# Patient Record
Sex: Male | Born: 1970
Health system: Southern US, Community
[De-identification: ages and names within clinical notes are randomized; demographics above are authoritative.]

## PROBLEM LIST (undated history)

## (undated) ENCOUNTER — Emergency Department (HOSPITAL_COMMUNITY): Disposition: A | Discharge status: Home / Self Care | Payer: Self-pay

## (undated) DIAGNOSIS — M549 Dorsalgia, unspecified: Secondary | ICD-10-CM

## (undated) DIAGNOSIS — G8929 Other chronic pain: Secondary | ICD-10-CM

## (undated) DIAGNOSIS — K219 Gastro-esophageal reflux disease without esophagitis: Secondary | ICD-10-CM

## (undated) HISTORY — DX: Gastro-esophageal reflux disease without esophagitis: K21.9

---

## 2002-06-28 ENCOUNTER — Encounter: Payer: Self-pay | Admitting: Emergency Medicine

## 2002-06-28 ENCOUNTER — Emergency Department (HOSPITAL_COMMUNITY): Admission: EM | Admit: 2002-06-28 | Discharge: 2002-06-28 | Payer: Self-pay

## 2006-04-13 ENCOUNTER — Emergency Department (HOSPITAL_COMMUNITY): Admission: EM | Admit: 2006-04-13 | Discharge: 2006-04-13 | Payer: Self-pay | Admitting: Emergency Medicine

## 2007-12-22 ENCOUNTER — Emergency Department (HOSPITAL_COMMUNITY): Admission: EM | Admit: 2007-12-22 | Discharge: 2007-12-22 | Payer: Self-pay | Admitting: Emergency Medicine

## 2009-09-29 ENCOUNTER — Emergency Department (HOSPITAL_COMMUNITY): Admission: EM | Admit: 2009-09-29 | Discharge: 2009-09-29 | Payer: Self-pay | Admitting: Family Medicine

## 2010-07-06 ENCOUNTER — Emergency Department (HOSPITAL_COMMUNITY): Admission: EM | Admit: 2010-07-06 | Discharge: 2010-07-06 | Payer: Self-pay | Admitting: Family Medicine

## 2011-01-13 ENCOUNTER — Inpatient Hospital Stay (INDEPENDENT_AMBULATORY_CARE_PROVIDER_SITE_OTHER)
Admission: RE | Admit: 2011-01-13 | Discharge: 2011-01-13 | Disposition: A | Discharge status: Home / Self Care | Payer: BC Managed Care – PPO | Source: Ambulatory Visit | Attending: Emergency Medicine | Admitting: Emergency Medicine

## 2011-01-13 DIAGNOSIS — R369 Urethral discharge, unspecified: Secondary | ICD-10-CM

## 2011-01-15 LAB — GC/CHLAMYDIA PROBE AMP, GENITAL
Chlamydia, DNA Probe: NEGATIVE
GC Probe Amp, Genital: POSITIVE — AB

## 2011-02-02 ENCOUNTER — Inpatient Hospital Stay (INDEPENDENT_AMBULATORY_CARE_PROVIDER_SITE_OTHER)
Admission: RE | Admit: 2011-02-02 | Discharge: 2011-02-02 | Disposition: A | Discharge status: Home / Self Care | Payer: BC Managed Care – PPO | Source: Ambulatory Visit | Attending: Family Medicine | Admitting: Family Medicine

## 2011-02-02 DIAGNOSIS — N342 Other urethritis: Secondary | ICD-10-CM

## 2011-03-31 ENCOUNTER — Inpatient Hospital Stay (INDEPENDENT_AMBULATORY_CARE_PROVIDER_SITE_OTHER)
Admission: RE | Admit: 2011-03-31 | Discharge: 2011-03-31 | Disposition: A | Discharge status: Home / Self Care | Payer: BC Managed Care – PPO | Source: Ambulatory Visit | Attending: Emergency Medicine | Admitting: Emergency Medicine

## 2011-03-31 DIAGNOSIS — M545 Low back pain: Secondary | ICD-10-CM

## 2011-06-16 ENCOUNTER — Inpatient Hospital Stay (INDEPENDENT_AMBULATORY_CARE_PROVIDER_SITE_OTHER)
Admission: RE | Admit: 2011-06-16 | Discharge: 2011-06-16 | Disposition: A | Discharge status: Home / Self Care | Payer: BC Managed Care – PPO | Source: Ambulatory Visit | Attending: Family Medicine | Admitting: Family Medicine

## 2011-06-16 DIAGNOSIS — F659 Paraphilia, unspecified: Secondary | ICD-10-CM

## 2011-06-16 LAB — CBC
HCT: 43.7
Hemoglobin: 14.3
MCHC: 32.6
MCV: 74 — ABNORMAL LOW
Platelets: 245
RBC: 5.91 — ABNORMAL HIGH
RDW: 15
WBC: 8.1

## 2011-06-16 LAB — POCT CARDIAC MARKERS
CKMB, poc: <1 — ABNORMAL LOW
Myoglobin, poc: 43.9
Operator id: 234501

## 2011-06-16 LAB — POCT I-STAT, CHEM 8
Calcium, Ion: 1.28
Glucose, Bld: 76
HCT: 47
Hemoglobin: 16

## 2011-06-16 LAB — DIFFERENTIAL
Basophils Absolute: 0
Basophils Relative: 0
Neutro Abs: 4.9
Neutrophils Relative %: 60

## 2011-07-07 ENCOUNTER — Emergency Department (HOSPITAL_COMMUNITY)
Admission: EM | Admit: 2011-07-07 | Discharge: 2011-07-07 | Disposition: A | Discharge status: Home / Self Care | Payer: No Typology Code available for payment source | Attending: Emergency Medicine | Admitting: Emergency Medicine

## 2011-07-07 ENCOUNTER — Emergency Department (HOSPITAL_COMMUNITY): Payer: No Typology Code available for payment source

## 2011-07-07 DIAGNOSIS — S139XXA Sprain of joints and ligaments of unspecified parts of neck, initial encounter: Secondary | ICD-10-CM | POA: Insufficient documentation

## 2011-07-07 DIAGNOSIS — M545 Low back pain, unspecified: Secondary | ICD-10-CM | POA: Insufficient documentation

## 2011-07-07 DIAGNOSIS — M542 Cervicalgia: Secondary | ICD-10-CM | POA: Insufficient documentation

## 2014-07-23 ENCOUNTER — Emergency Department (HOSPITAL_COMMUNITY)
Admission: EM | Admit: 2014-07-23 | Discharge: 2014-07-23 | Disposition: A | Discharge status: Home / Self Care | Payer: Commercial Managed Care - PPO | Attending: Emergency Medicine | Admitting: Emergency Medicine

## 2014-07-23 ENCOUNTER — Emergency Department (HOSPITAL_COMMUNITY): Payer: Commercial Managed Care - PPO

## 2014-07-23 ENCOUNTER — Encounter (HOSPITAL_COMMUNITY): Payer: Self-pay | Admitting: *Deleted

## 2014-07-23 DIAGNOSIS — R05 Cough: Secondary | ICD-10-CM

## 2014-07-23 DIAGNOSIS — R059 Cough, unspecified: Secondary | ICD-10-CM

## 2014-07-23 DIAGNOSIS — R1110 Vomiting, unspecified: Secondary | ICD-10-CM | POA: Insufficient documentation

## 2014-07-23 DIAGNOSIS — R0789 Other chest pain: Secondary | ICD-10-CM | POA: Diagnosis not present

## 2014-07-23 DIAGNOSIS — R0602 Shortness of breath: Secondary | ICD-10-CM | POA: Diagnosis not present

## 2014-07-23 DIAGNOSIS — R079 Chest pain, unspecified: Secondary | ICD-10-CM | POA: Diagnosis present

## 2014-07-23 LAB — CBC
HCT: 42.6 % (ref 39.0–52.0)
HEMOGLOBIN: 13.8 g/dL (ref 13.0–17.0)
MCH: 23 pg — ABNORMAL LOW (ref 26.0–34.0)
MCHC: 32.4 g/dL (ref 30.0–36.0)
MCV: 71.1 fL — ABNORMAL LOW (ref 78.0–100.0)
Platelets: 260 10*3/uL (ref 150–400)
RBC: 5.99 MIL/uL — ABNORMAL HIGH (ref 4.22–5.81)
RDW: 15.2 % (ref 11.5–15.5)
WBC: 7.5 10*3/uL (ref 4.0–10.5)

## 2014-07-23 LAB — BASIC METABOLIC PANEL
ANION GAP: 15 (ref 5–15)
BUN: 14 mg/dL (ref 6–23)
CALCIUM: 9.6 mg/dL (ref 8.4–10.5)
CO2: 23 meq/L (ref 19–32)
CREATININE: 0.81 mg/dL (ref 0.50–1.35)
Chloride: 103 mEq/L (ref 96–112)
GFR calc Af Amer: >90 mL/min (ref >90)
GFR calc non Af Amer: >90 mL/min (ref >90)
Glucose, Bld: 94 mg/dL (ref 70–99)
Potassium: 4 mEq/L (ref 3.7–5.3)
Sodium: 141 mEq/L (ref 137–147)

## 2014-07-23 LAB — I-STAT TROPONIN, ED: Troponin i, poc: 0.01 ng/mL (ref 0.00–0.08)

## 2014-07-23 MED ORDER — OMEPRAZOLE 20 MG PO CPDR: 20.0000 mg | DELAYED_RELEASE_CAPSULE | Freq: Every day | ORAL | Status: DC

## 2014-07-23 NOTE — ED Provider Notes (Signed)
CSN: [id number]     Arrival date & time 07/23/14  1425 History   First MD Initiated Contact with Patient 07/23/14 1737     Chief Complaint  Patient presents with  . Chest Pain  . Pleurisy     (Consider location/radiation/quality/duration/timing/severity/associated sxs/prior Treatment) Patient is a 43 y.o. male presenting with chest pain.  Chest Pain Pain location:  L chest Pain quality: sharp   Pain radiates to:  Does not radiate Pain severity:  Moderate Onset quality: gradual, but acutely worse today. Duration: Greater than 1 week. Timing:  Constant Progression:  Worsening Chronicity:  New Context: lifting   Relieved by:  Rest Exacerbated by: Twisting, turning, palpation. Associated symptoms: shortness of breath (Particularly during coughing fits.) and vomiting (After coughing fits.)   Associated symptoms: no abdominal pain, no diaphoresis, no dizziness, no fever and no nausea     History reviewed. No pertinent past medical history. History reviewed. No pertinent past surgical history. History reviewed. No pertinent family history. History  Substance Use Topics  . Smoking status: Never Smoker   . Smokeless tobacco: Not on file  . Alcohol Use: No    Review of Systems  Constitutional: Negative for fever and diaphoresis.  Respiratory: Positive for shortness of breath (Particularly during coughing fits.).   Cardiovascular: Positive for chest pain.  Gastrointestinal: Positive for vomiting (After coughing fits.). Negative for nausea and abdominal pain.  Neurological: Negative for dizziness.  All other systems reviewed and are negative.     Allergies  Review of patient's allergies indicates no known allergies.  Home Medications   Prior to Admission medications   Not on File   BP 136/92 mmHg  Pulse 80  Temp(Src) 97.7 F (36.5 C) (Oral)  Resp 16  Ht 5\' 9"  (1.753 m)  Wt 230 lb (104.327 kg)  BMI 33.95 kg/m2  SpO2 100% Physical Exam  Constitutional: He is  oriented to person, place, and time. He appears well-developed and well-nourished. No distress.  HENT:  Head: Normocephalic and atraumatic.  Mouth/Throat: Oropharynx is clear and moist.  Eyes: Conjunctivae are normal. Pupils are equal, round, and reactive to light. No scleral icterus.  Neck: Neck supple.  Cardiovascular: Normal rate, regular rhythm, normal heart sounds and intact distal pulses.   No murmur heard. Pulmonary/Chest: Effort normal and breath sounds normal. No stridor. No respiratory distress. He has no wheezes. He has no rales. He exhibits tenderness (Left sided).  Abdominal: Soft. He exhibits no distension. There is no tenderness. There is no rebound and no guarding.  Musculoskeletal: Normal range of motion. He exhibits no edema.  Neurological: He is alert and oriented to person, place, and time.  Skin: Skin is warm and dry. No rash noted.  Psychiatric: He has a normal mood and affect. His behavior is normal.  Nursing note and vitals reviewed.   ED Course  Procedures (including critical care time) Labs Review Labs Reviewed  CBC - Abnormal; Notable for the following:    RBC 5.99 (*)    MCV 71.1 (*)    MCH 23.0 (*)    All other components within normal limits  BASIC METABOLIC PANEL  I-STAT TROPOININ, ED    Imaging Review Dg Chest 2 View  07/23/2014   CLINICAL DATA:  Two days of shortness of breath and chest pain; no history of tobacco use.  EXAM: CHEST  2 VIEW  COMPARISON:  PA and lateral chest x-ray of December 22, 2007  FINDINGS: The lungs are less well inflated today. There is  no focal infiltrate. There is some crowding of the pulmonary interstitial markings. The cardiac silhouette is top-normal in size. The pulmonary vascularity is not engorged. There is mild tortuosity of the descending thoracic aorta. The bony thorax is unremarkable.  IMPRESSION: The study is limited due to hypoinflation. There is no definite evidence of pneumonia nor other acute cardiopulmonary  abnormality.   Electronically Signed   By: David  Martinique   On: 07/23/2014 15:22  All radiology studies independently viewed by me.      EKG Interpretation   Date/Time:  Monday July 23 2014 14:29:00 EST Ventricular Rate:  101 PR Interval:  146 QRS Duration: 96 QT Interval:  332 QTC Calculation: 430 R Axis:   69 Text Interpretation:  Sinus tachycardia Incomplete right bundle branch  block Cannot rule out Inferior infarct , age undetermined Cannot rule out  Anterior infarct , age undetermined Abnormal ECG compared to prior, now  shows borderline Incomplete RBBB morphology Confirmed by [facility]  MD, TREY  ([id number]) on 07/23/2014 6:53:14 PM      MDM   Final diagnoses:  Chest pain    43 year old male presenting with left sided chest wall pain. 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 His exam and history are consistent with musculoskeletal chest wall pain.  He has no cardiac risk factors. His EKG shows some questionable changes, his history is in no way consistent with ACS.  He also complains of a nightly cough, which is exacerbating his left chest wall pain. Suspect this is from GERD. Recommended trial of omeprazole. Advised follow-up with PCP for recheck.    Artis Delay, MD 07/23/14 [phone]

## 2014-07-23 NOTE — Discharge Instructions (Signed)
Chest Wall Pain °Chest wall pain is pain in or around the bones and muscles of your chest. It may take up to 6 weeks to get better. It may take longer if you must stay physically active in your work and activities.  °CAUSES  °Chest wall pain may happen on its own. However, it may be caused by: °· A viral illness like the flu. °· Injury. °· Coughing. °· Exercise. °· Arthritis. °· Fibromyalgia. °· Shingles. °HOME CARE INSTRUCTIONS  °· Avoid overtiring physical activity. Try not to strain or perform activities that cause pain. This includes any activities using your chest or your abdominal and side muscles, especially if heavy weights are used. °· Put ice on the sore area. °¨ Put ice in a plastic bag. °¨ Place a towel between your skin and the bag. °¨ Leave the ice on for 15-20 minutes per hour while awake for the first 2 days. °· Only take over-the-counter or prescription medicines for pain, discomfort, or fever as directed by your caregiver. °SEEK IMMEDIATE MEDICAL CARE IF:  °· Your pain increases, or you are very uncomfortable. °· You have a fever. °· Your chest pain becomes worse. °· You have new, unexplained symptoms. °· You have nausea or vomiting. °· You feel sweaty or lightheaded. °· You have a cough with phlegm (sputum), or you cough up blood. °MAKE SURE YOU:  °· Understand these instructions. °· Will watch your condition. °· Will get help right away if you are not doing well or get worse. °Document Released: 09/07/2005 Document Revised: 11/30/2011 Document Reviewed: 05/04/2011 °ExitCare® Patient Information ©2015 [employer]. This information is not intended to replace advice given to you by your health care provider. Make sure you discuss any questions you have with your health care provider. ° ° °Emergency Department Resource Guide °1) Find a Doctor and Pay Out of Pocket °Although you won't have to find out who is covered by your insurance plan, it is a good idea to ask around and get recommendations. You  will then need to call the office and see if the doctor you have chosen will accept you as a new patient and what types of options they offer for patients who are self-pay. Some doctors offer discounts or will set up payment plans for their patients who do not have insurance, but you will need to ask so you aren't surprised when you get to your appointment. ° °2) Contact Your Local Health Department °Not all health departments have doctors that can see patients for sick visits, but many do, so it is worth a call to see if yours does. If you don't know where your local health department is, you can check in your phone book. The CDC also has a tool to help you locate your state's health department, and many state websites also have listings of all of their local health departments. ° °3) Find a Walk-in Clinic °If your illness is not likely to be very severe or complicated, you may want to try a walk in clinic. These are popping up all over the country in pharmacies, drugstores, and shopping centers. They're usually staffed by nurse practitioners or physician assistants that have been trained to treat common illnesses and complaints. They're usually fairly quick and inexpensive. However, if you have serious medical issues or chronic medical problems, these are probably not your best option. ° °No Primary Care Doctor: °- Call [facility] at  832-8000 - they can help you locate a primary care doctor that    accepts your insurance, provides certain services, etc. °- Physician Referral Service- 1-800-533-3463 ° °Chronic Pain Problems: °Organization         Address  Phone   Notes  °[facility]  (336) 297-2271 Patients need to be referred by their primary care doctor.  ° °Medication Assistance: °Organization         Address  Phone   Notes  °[place] [facility] [address] °[place], [place] [address] (336) 641-8030 --Must be a resident of [place] °-- Must  have NO insurance coverage whatsoever (no Medicaid/ Medicare, etc.) °-- The pt. MUST have a primary care doctor that directs their care regularly and follows them in the community °  °[facility]  (866) 331-1348   °[employer]  (888) 892-1162   ° °Agencies that provide inexpensive medical care: °Organization         Address  Phone   Notes  °[facility]  (336) 832-8035   °[facility]    (336) 832-7272   °[facility] [address] °[place], [place] [address] (336) 832-4777   °[facility] [address], °[place] (336) 271-4999   °[facility]    (336) 373-0678   °[facility]    (336) 272-1050   °[facility] ° [address], [place] Phone:  (336) 832-4444, Fax:  (336) 832-4440 Hours of Operation:  9 am - 6 pm, M-F.  Also accepts Medicaid/Medicare and self-pay.  °[facility] ° [address], [place] Phone: (336) 832-3150, Fax: (336) 832-3151. Hours of Operation:  8:30 am - 5:30 pm, M-F.  Also accepts Medicaid and self-pay.  °[facility] [place] [address], [place] Phone: (336) 878-6027   °[facility] [address], [place], [place] (336)723-1848, Ext. 123 Mondays & Thursdays: 7-9 AM.  First 15 patients are seen on a first come, first serve basis. °  ° °Medicaid-accepting [place] Providers: ° °Organization         Address  Phone   Notes  °[facility] [address], [place] (336) 641-2100 Also accepts self-pay patients.  °[facility] [address], [place] ° (336) 856-9996   °[facility] [address], [place] (336) 288-8857   °[facility] [address] [place] [address], [place] (336) 299-7000   °Veita Bland [address], [place]  ° (336) 373-1557 Only accepts [place] Access Medicaid patients after  they have their name applied to their card.  ° °Self-Pay (no insurance) in [place]: ° °Organization         Address  Phone   Notes  °Sickle Cell Patients, [facility] [address], [place] (336) 832-1970   °[facility] [address], [place] (336) 832-4400   ° [facility] ° [address], [place] (336) 992-4800   °[facility]/Dr. Osei-Bonsu ° [address] [place] [address], [place] or [address], [place] (336) 841-8500 Phone number for both [place] and [place] locations is the same.  °[facility] [address], [place] (336) 299-0000   °[facility] [address] [place] [address],  or [address] (336) 852-7530 °(336) 878-2260   °[facility] [address], [place] (336) 350-1642, phone; (336) 294-5005, fax Sees patients 1st and 3rd Saturday of every month.  Must not qualify for public or private insurance (i.e. Medicaid, Medicare, [place] Health Choice, Veterans' Benefits) • Household income should be no more than 200% of the poverty level •The clinic cannot treat you if you are pregnant or think you are pregnant • Sexually transmitted diseases are not treated at the clinic.  ° ° °Dental Care: °Organization         Address  Phone  Notes  °[facility] [address], [place] (336) 641-6152 Accepts children up to age 21 who are enrolled in Medicaid or [place] Health Choice; pregnant women with a Medicaid card; and children who have applied for Medicaid or [place] Health Choice, but were declined, whose parents can pay a reduced fee at time of service.  °[facility] [place]  [address], [place] (336) 641-7733 Accepts children up to age 21 who are enrolled in Medicaid or [place] Health Choice; pregnant women with a Medicaid card; and children who  have applied for Medicaid or [place] Health Choice, but were declined, whose parents can pay a reduced fee at time of service.  °[facility] ° [address], [place] (336) 641-4533 Patients are seen by appointment only. Walk-ins are not accepted. [facility] will see patients 18 years of age and older. °Monday - Tuesday (8am-5pm) °Most Wednesdays (8:30-5pm) °$30 per visit, cash only  °[facility] ° [address], [place] (336) 641-4533 Patients are seen by appointment only. Walk-ins are not accepted. [facility] will see patients 18 years of age and older. °One Wednesday Evening (Monthly: Volunteer Based).  $30 per visit, cash only  °[facility]  (919) 537-3737 for adults; Children under age 4, call Graduate Pediatric Dentistry at (919) [phone]. Children aged 4-14, please call (919) [phone] to request a pediatric application. ° Dental services are provided in all areas of dental care including fillings, crowns and bridges, complete and partial dentures, implants, gum treatment, root canals, and extractions. Preventive care is also provided. Treatment is provided to both adults and children. °Patients are selected via a lottery and there is often a waiting list. °  °[facility] [address], °[place] ° (336) 763-8833 [email or web address] °  °[facility] [address], [place], [place] (336)723-1848, Ext. 123 Second and Fourth Thursday of each month, opens at 6:30 AM; Clinic ends at 9 AM.  Patients are seen on a first-come first-served basis, and a limited number are seen during each clinic.  ° °[facility] ° [address], [place], [place] (336) 723-7904   Eligibility Requirements °You must have lived in [place], [place], or [place] counties for at least the last three months. °  You cannot be eligible for state or federal sponsored healthcare insurance, including Veterans  Administration, Medicaid, or Medicare. °  You generally cannot be eligible for healthcare insurance through your employer.  °  How to apply: °Eligibility screenings are held every Tuesday and Wednesday afternoon from 1:00 pm until 4:00 pm. You do not need an appointment for the interview!  °[facility] [address], [place], [place] 336-631-2330   °[facility]  336-342-8273   °[facility]  336-703-3100   °[facility]    336-570-6415   ° °Behavioral Health Resources in the Community: °Intensive Outpatient Programs °Organization         Address  Phone  Notes  °[facility] [address], [place], [place] 336-878-6098   °[facility] [address], [place], [place] 336-832-9800   °[facility] [address], [place], [place] ° 336-882-2125   °[facility] [address],  °[place], [place] 1-800-853-5163 or 336-641-4981   °Substance Abuse Resources °Organization         Address  Phone  Notes  °[facility]  336-882-2125   °[facility]  336-784-9470   °[facility]  336-285-9073   °[facility]  336-845-3988   °Residential & Outpatient Substance Abuse Program  1-800-659-3381   °Psychological Services °Organization         Address  Phone  Notes  °[facility]  336- 832-9600   °[facility]  336- 378-7881   °[facility] [address], [place] 1-800-853-5163 or 336-641-4981   ° °Mobile Crisis Teams °Organization         Address  Phone  Notes  °[facility], Mobile Crisis Care Unit  1-877-626-1772   °Assertive °[facility] ° [address] [place], [place] 336-834-9664   °Sharon DeEsch [address] °[place] 336-554-5454   ° °Self-Help/Support Groups °Organization         Address  Phone             Notes  °[facility] -  variety of support groups  336- 373-1402 Call for more information  °Narcotics Anonymous (NA), [facility] [address], °[place]  2 meetings at this location  ° °Residential Treatment Programs °Organization         Address  Phone  Notes  °[facility] [address],    °[place]  1-866-801-8205   °[facility] ° [address], [place], [place] 704-293-8524   °[facility] [address], [place] 336-845-3988 Admissions: 8am-3pm M-F  °[facility] [address]    °[place], [place] 336-841-1104   °[facility] [address], [place], [place] 336-379-7146   °[facility] [address]  °[place], [place] 336-285-9073   °[facility] - Intensive Outpatient [address], [place], [place] 336-852-3033   °[facility] [address]  °[place], [place] 1-877-615-2722 or 336-784-9470   °[facility] [address] [place], [place] 336-227-7417 Accepts Medicaid  °[facility] [address]  ° [place] 1-800-659-3381 Substance Abuse/Addiction Treatment  ° °[place] Behavioral Health Resources °Organization         Address  Phone  Notes  °[facility]  (888) 581-9988   °Julie Brannon, PhD [address] [place], [place]   (336) 349-5553 or (336) 951-0000   °[facility]   [address] °[place], [place] (336) 349-4454   °[facility] [address], [place], [place] (336) 342-8316 Insurance/Medicaid/sponsorship through [facility]  °[facility] [address]                                    [place], [place] (336) 342-8316 Therapy/tele-psych/case  °[facility] [address]  ° [place], [place] (336) 349-2233    °Dr. Arfeen  (336) 349-4544   °[facility] [place]  [employer] [place] [facility] 1) [address], [place] °2) [address], [place] °3)  [address], [place] (336) 349-3220 °(336) 342-7768 ° °(336) 342-8140   °[place] [facility] (336) 342-1394 or (336) 342-3537 (After Hours)    ° ° ° °

## 2014-07-23 NOTE — ED Notes (Signed)
Left sided cp, radiating to left arm and jaw. Intermittently x 2 days. Hurts more when sitting up and chest out.  Did not take nothing for pain. No pcp.

## 2015-01-12 ENCOUNTER — Encounter (HOSPITAL_COMMUNITY): Payer: Self-pay | Admitting: Emergency Medicine

## 2015-01-12 DIAGNOSIS — M546 Pain in thoracic spine: Secondary | ICD-10-CM | POA: Diagnosis not present

## 2015-01-12 DIAGNOSIS — R0602 Shortness of breath: Secondary | ICD-10-CM | POA: Diagnosis not present

## 2015-01-12 DIAGNOSIS — R079 Chest pain, unspecified: Secondary | ICD-10-CM | POA: Diagnosis present

## 2015-01-12 DIAGNOSIS — Z79899 Other long term (current) drug therapy: Secondary | ICD-10-CM | POA: Diagnosis not present

## 2015-01-12 DIAGNOSIS — R0789 Other chest pain: Secondary | ICD-10-CM | POA: Insufficient documentation

## 2015-01-12 LAB — PROTIME-INR
INR: 0.97 (ref 0.00–1.49)
Prothrombin Time: 13 seconds (ref 11.6–15.2)

## 2015-01-12 LAB — I-STAT TROPONIN, ED: TROPONIN I, POC: 0.01 ng/mL (ref 0.00–0.08)

## 2015-01-12 NOTE — ED Notes (Signed)
Patient here with complaint of left chest pain with radiation to left lateral ribs and around to back. Palpation of posterior left ribs increases pain. Patient states that he was opening the trunk of his vehicle today when the pain began. States that deep breath exacerbates pain and certain movements worsen pain.

## 2015-01-13 ENCOUNTER — Emergency Department (HOSPITAL_COMMUNITY): Payer: Commercial Managed Care - PPO

## 2015-01-13 ENCOUNTER — Emergency Department (HOSPITAL_COMMUNITY)
Admission: EM | Admit: 2015-01-13 | Discharge: 2015-01-13 | Disposition: A | Discharge status: Home / Self Care | Payer: Commercial Managed Care - PPO | Attending: Emergency Medicine | Admitting: Emergency Medicine

## 2015-01-13 DIAGNOSIS — R0789 Other chest pain: Secondary | ICD-10-CM

## 2015-01-13 LAB — BASIC METABOLIC PANEL
Anion gap: 8 (ref 5–15)
BUN: 13 mg/dL (ref 6–23)
CHLORIDE: 105 mmol/L (ref 96–112)
CO2: 23 mmol/L (ref 19–32)
Calcium: 9.2 mg/dL (ref 8.4–10.5)
Creatinine, Ser: 0.88 mg/dL (ref 0.50–1.35)
Glucose, Bld: 99 mg/dL (ref 70–99)
POTASSIUM: 4 mmol/L (ref 3.5–5.1)
SODIUM: 136 mmol/L (ref 135–145)

## 2015-01-13 LAB — CBC
HEMATOCRIT: 41.7 % (ref 39.0–52.0)
HEMOGLOBIN: 13.7 g/dL (ref 13.0–17.0)
MCH: 23.6 pg — ABNORMAL LOW (ref 26.0–34.0)
MCHC: 32.9 g/dL (ref 30.0–36.0)
MCV: 71.8 fL — ABNORMAL LOW (ref 78.0–100.0)
Platelets: 257 10*3/uL (ref 150–400)
RBC: 5.81 MIL/uL (ref 4.22–5.81)
RDW: 15 % (ref 11.5–15.5)
WBC: 8.3 10*3/uL (ref 4.0–10.5)

## 2015-01-13 MED ORDER — NAPROXEN 500 MG PO TABS: 500.0000 mg | ORAL_TABLET | Freq: Two times a day (BID) | ORAL | Status: DC

## 2015-01-13 MED ORDER — OXYCODONE-ACETAMINOPHEN 5-325 MG PO TABS
2.0000 | ORAL_TABLET | Freq: Once | ORAL | Status: AC
  Administered 2015-01-13: 2 via ORAL
  Filled 2015-01-13: qty 2

## 2015-01-13 MED ORDER — METHOCARBAMOL 500 MG PO TABS
1000.0000 mg | ORAL_TABLET | Freq: Once | ORAL | Status: AC
  Administered 2015-01-13: 500 mg via ORAL
  Filled 2015-01-13: qty 2

## 2015-01-13 MED ORDER — OXYCODONE-ACETAMINOPHEN 5-325 MG PO TABS
2.0000 | ORAL_TABLET | ORAL | Status: DC | PRN
Start: 2015-01-13 — End: 2015-10-09

## 2015-01-13 MED ORDER — METHOCARBAMOL 750 MG PO TABS: 750.0000 mg | ORAL_TABLET | Freq: Four times a day (QID) | ORAL | Status: DC

## 2015-01-13 MED ORDER — OMEPRAZOLE 20 MG PO CPDR: 20.0000 mg | DELAYED_RELEASE_CAPSULE | Freq: Every day | ORAL | Status: DC

## 2015-01-13 NOTE — ED Provider Notes (Signed)
CSN: [id number]     Arrival date & time 01/12/15  2306 History  This chart was scribed for Linton Flemings, MD by Eustaquio Maize, ED Scribe. This patient was seen in room A08C/A08C and the patient's care was started at 1:30 AM.    Chief Complaint  Patient presents with  . Chest Pain  . Shortness of Breath   The history is provided by the patient. No language interpreter was used.     HPI Comments: Trevor Ramos is a 44 y.o. male who presents to the Emergency Department complaining of sudden onset, constant left sided chest pain radiating to mid back that began around 1 PM today (12 hours ago). Pt was opening the trunk of his car when the pain began. He describes the pain as a sharp sensation. The pain is exacerbated with movement. He also complains of shortness of breath. Pt took Ibuprofen earlier with mild relief. He reports that he mowed his lawn earlier in the week but denies any new recent strenuous activity. He denies diaphoresis, nausea, vomiting, leg swelling, or any other symptoms. No recent prolonged travel. Occasional smoker. Denies positive family history of cardiac disease.    History reviewed. No pertinent past medical history. History reviewed. No pertinent past surgical history. History reviewed. No pertinent family history. History  Substance Use Topics  . Smoking status: Never Smoker   . Smokeless tobacco: Not on file  . Alcohol Use: Yes     Comment: occ    Review of Systems  Constitutional: Negative for fever and diaphoresis.  HENT: Negative for rhinorrhea.   Respiratory: Positive for shortness of breath.   Cardiovascular: Positive for chest pain. Negative for leg swelling.  Gastrointestinal: Negative for nausea and vomiting.  Musculoskeletal: Positive for back pain.  Neurological: Negative for speech difficulty.  Psychiatric/Behavioral: Negative for confusion.      Allergies  Review of patient's allergies indicates no known allergies.  Home Medications    Prior to Admission medications   Medication Sig Start Date End Date Taking? Authorizing Provider  omeprazole (PRILOSEC) 20 MG capsule Take 1 capsule (20 mg total) by mouth daily. 07/23/14  Yes Serita Grit, MD  methocarbamol (ROBAXIN-750) 750 MG tablet Take 1 tablet (750 mg total) by mouth 4 (four) times daily. 01/13/15   Linton Flemings, MD  naproxen (NAPROSYN) 500 MG tablet Take 1 tablet (500 mg total) by mouth 2 (two) times daily. 01/13/15   Linton Flemings, MD  oxyCODONE-acetaminophen (PERCOCET/ROXICET) 5-325 MG per tablet Take 2 tablets by mouth every 4 (four) hours as needed for severe pain. 01/13/15   Linton Flemings, MD   Triage Vitals: BP 116/78 mmHg  Pulse 79  Temp(Src) 98.2 F (36.8 C)  Resp 20  SpO2 98%   Physical Exam  Constitutional: He is oriented to person, place, and time. He appears well-developed and well-nourished.  HENT:  Head: Normocephalic and atraumatic.  Nose: Nose normal.  Mouth/Throat: Oropharynx is clear and moist.  Eyes: Conjunctivae and EOM are normal. Pupils are equal, round, and reactive to light.  Neck: Normal range of motion. Neck supple. No JVD present. No tracheal deviation present. No thyromegaly present.  Cardiovascular: Normal rate, regular rhythm, normal heart sounds and intact distal pulses.  Exam reveals no gallop and no friction rub.   No murmur heard. Pulmonary/Chest: Effort normal and breath sounds normal. No stridor. No respiratory distress. He has no wheezes. He has no rales. He exhibits tenderness (patient has tenderness just under left nipple starting his sternum and wrapping  around to left scapula.  There is no overlying skin findings, no rash.  No vesicles.  Pain is worse with compression of the chest, anterior to posterior.).  Abdominal: Soft. Bowel sounds are normal. He exhibits no distension and no mass. There is no tenderness. There is no rebound and no guarding.  Musculoskeletal: Normal range of motion. He exhibits no edema or tenderness.   Lymphadenopathy:    He has no cervical adenopathy.  Neurological: He is alert and oriented to person, place, and time. He displays normal reflexes. He exhibits normal muscle tone. Coordination normal.  Skin: Skin is warm and dry. No rash noted. No erythema. No pallor.  Psychiatric: He has a normal mood and affect. His behavior is normal. Judgment and thought content normal.  Nursing note and vitals reviewed.   ED Course  Procedures (including critical care time)  DIAGNOSTIC STUDIES: Oxygen Saturation is 98% on RA, normal by my interpretation.    COORDINATION OF CARE: 1:35 AM-Discussed treatment plan which includes pain medication with pt at bedside and pt agreed to plan.   Labs Review Labs Reviewed  CBC - Abnormal; Notable for the following:    MCV 71.8 (*)    MCH 23.6 (*)    All other components within normal limits  BASIC METABOLIC PANEL  PROTIME-INR  Randolm Idol, ED    Imaging Review Dg Chest 2 View  01/13/2015   CLINICAL DATA:  Left-sided chest pain  EXAM: CHEST  2 VIEW  COMPARISON:  07/23/2014.  FINDINGS: Mildly enlarged cardiomediastinal silhouette. Aortic atherosclerosis. Low lung volumes. No active infiltrates or failure. No osseous findings.  IMPRESSION: Cardiomegaly.  No active infiltrates or failure.   Electronically Signed   By: Rolla Flatten M.D.   On: 01/13/2015 02:00     EKG Interpretation   Date/Time:  Saturday January 12 2015 23:12:21 EDT Ventricular Rate:  85 PR Interval:  150 QRS Duration: 94 QT Interval:  356 QTC Calculation: 423 R Axis:   55 Text Interpretation:  Normal sinus rhythm Minimal voltage criteria for  LVH, may be normal variant Inferior infarct , age undetermined Abnormal  ECG No significant change since last tracing Confirmed by Quanell Loughney  MD, Chaeli Judy  ([id number]) on 01/12/2015 11:16:52 PM      MDM   Final diagnoses:  Chest wall pain    I personally performed the services described in this documentation, which was scribed in my  presence. The recorded information has been reviewed and is accurate.  44 year old male with left-sided chest pain ongoing throughout the day day after opening the trunk of his car.  Patient denies any strenuous activity, no lifting, twisting.  He has had chest wall pain in the past, but nothing like this.  Is not pleuritic in nature.  He is not short of breath.  Workup here without signs of ACS.  Perc negative.  Pain is reproducible with palpation.  No fractures or other abnormalities noted on chest x-ray.  Plan for treatment of chest wall pain with close follow-up with primary care doctor.     Linton Flemings, MD 01/13/15 [id number]

## 2015-01-13 NOTE — Discharge Instructions (Signed)
Chest Wall Pain Chest wall pain is pain in or around the bones and muscles of your chest. It may take up to 6 weeks to get better. It may take longer if you must stay physically active in your work and activities.  CAUSES  Chest wall pain may happen on its own. However, it may be caused by:  A viral illness like the flu.  Injury.  Coughing.  Exercise.  Arthritis.  Fibromyalgia.  Shingles. HOME CARE INSTRUCTIONS   Avoid overtiring physical activity. Try not to strain or perform activities that cause pain. This includes any activities using your chest or your abdominal and side muscles, especially if heavy weights are used.  Put ice on the sore area.  Put ice in a plastic bag.  Place a towel between your skin and the bag.  Leave the ice on for 15-20 minutes per hour while awake for the first 2 days.  Only take over-the-counter or prescription medicines for pain, discomfort, or fever as directed by your caregiver. SEEK IMMEDIATE MEDICAL CARE IF:   Your pain increases, or you are very uncomfortable.  You have a fever.  Your chest pain becomes worse.  You have new, unexplained symptoms.  You have nausea or vomiting.  You feel sweaty or lightheaded.  You have a cough with phlegm (sputum), or you cough up blood. MAKE SURE YOU:   Understand these instructions.  Will watch your condition.  Will get help right away if you are not doing well or get worse. Document Released: 09/07/2005 Document Revised: 11/30/2011 Document Reviewed: 05/04/2011 [facility] Patient Information 2015 [place], [place]. This information is not intended to replace advice given to you by your health care provider. Make sure you discuss any questions you have with your health care provider.  Musculoskeletal Pain Musculoskeletal pain is muscle and boney aches and pains. These pains can occur in any part of the body. Your caregiver may treat you without knowing the cause of the pain. They may treat you  if blood or urine tests, X-rays, and other tests were normal.  CAUSES There is often not a definite cause or reason for these pains. These pains may be caused by a type of germ (virus). The discomfort may also come from overuse. Overuse includes working out too hard when your body is not fit. Boney aches also come from weather changes. Bone is sensitive to atmospheric pressure changes. HOME CARE INSTRUCTIONS   Ask when your test results will be ready. Make sure you get your test results.  Only take over-the-counter or prescription medicines for pain, discomfort, or fever as directed by your caregiver. If you were given medications for your condition, do not drive, operate machinery or power tools, or sign legal documents for 24 hours. Do not drink alcohol. Do not take sleeping pills or other medications that may interfere with treatment.  Continue all activities unless the activities cause more pain. When the pain lessens, slowly resume normal activities. Gradually increase the intensity and duration of the activities or exercise.  During periods of severe pain, bed rest may be helpful. Lay or sit in any position that is comfortable.  Putting ice on the injured area.  Put ice in a bag.  Place a towel between your skin and the bag.  Leave the ice on for 15 to 20 minutes, 3 to 4 times a day.  Follow up with your caregiver for continued problems and no reason can be found for the pain. If the pain becomes worse  or does not go away, it may be necessary to repeat tests or do additional testing. Your caregiver may need to look further for a possible cause. SEEK IMMEDIATE MEDICAL CARE IF:  You have pain that is getting worse and is not relieved by medications.  You develop chest pain that is associated with shortness or breath, sweating, feeling sick to your stomach (nauseous), or throw up (vomit).  Your pain becomes localized to the abdomen.  You develop any new symptoms that seem different  or that concern you. MAKE SURE YOU:   Understand these instructions.  Will watch your condition.  Will get help right away if you are not doing well or get worse. Document Released: 09/07/2005 Document Revised: 11/30/2011 Document Reviewed: 05/12/2013 [facility] [place] [facility] Patient Information 2015 [place], [place]. This information is not intended to replace advice given to you by your health care provider. Make sure you discuss any questions you have with your health care provider.

## 2015-10-09 ENCOUNTER — Ambulatory Visit (INDEPENDENT_AMBULATORY_CARE_PROVIDER_SITE_OTHER): Payer: Commercial Managed Care - PPO | Admitting: Family

## 2015-10-09 ENCOUNTER — Encounter: Payer: Self-pay | Admitting: Family

## 2015-10-09 VITALS — BP 122/88 | HR 79 | Temp 98.2°F | Resp 16 | Ht 69.0 in | Wt 242.0 lb

## 2015-10-09 DIAGNOSIS — N529 Male erectile dysfunction, unspecified: Secondary | ICD-10-CM | POA: Diagnosis not present

## 2015-10-09 MED ORDER — SILDENAFIL CITRATE 100 MG PO TABS: 100.0000 mg | ORAL_TABLET | Freq: Every day | ORAL | Status: DC | PRN

## 2015-10-09 NOTE — Progress Notes (Signed)
Subjective:    Patient ID: Trevor Ramos, male    DOB: October 24, 1970, 44 y.o.   MRN: [id number]  Chief Complaint  Patient presents with  . Establish Care    vasectomy next week and has questions    HPI:  Trevor Ramos is a 45 y.o. male who  has a past medical history of GERD (gastroesophageal reflux disease). and presents today for an office visit to establish care.  Associated symptom of weakened erections has been going on for about 6-7 months. Describes that he is able to obtain an erection but does have weakened erections and sometime inability to maintain an erection. Denies any modifying factors or attempted treatments to help with his symptoms. Does have fatigue, but he does work 2 jobs on a regular basis. No significant family history for co-morbid conditions. Denies issues with climax. Scheduled to undergo a vasectomy within the next week.   No Known Allergies   Outpatient Prescriptions Prior to Visit  Medication Sig Dispense Refill  . methocarbamol (ROBAXIN-750) 750 MG tablet Take 1 tablet (750 mg total) by mouth 4 (four) times daily. 40 tablet 0  . naproxen (NAPROSYN) 500 MG tablet Take 1 tablet (500 mg total) by mouth 2 (two) times daily. 30 tablet 0  . omeprazole (PRILOSEC) 20 MG capsule Take 1 capsule (20 mg total) by mouth daily. 30 capsule 0  . oxyCODONE-acetaminophen (PERCOCET/ROXICET) 5-325 MG per tablet Take 2 tablets by mouth every 4 (four) hours as needed for severe pain. 20 tablet 0   No facility-administered medications prior to visit.     Past Medical History  Diagnosis Date  . GERD (gastroesophageal reflux disease)      History reviewed. No pertinent past surgical history.   Family History  Problem Relation Age of Onset  . Healthy Mother   . Healthy Father      Social History   Social History  . Marital Status: Single    Spouse Name: N/A  . Number of Children: [id number]  . Years of Education: 14   Occupational History  . Receving Clerk       Social History Main Topics  . Smoking status: Never Smoker   . Smokeless tobacco: Never Used  . Alcohol Use: Yes     Comment: occ  . Drug Use: No  . Sexual Activity: Not on file   Other Topics Concern  . Not on file   Social History Narrative   Fun: Spend time with family    Review of Systems  Constitutional: Negative for fever and chills.  Respiratory: Negative for chest tightness and shortness of breath.   Cardiovascular: Negative for chest pain, palpitations and leg swelling.      Objective:    BP 122/88 mmHg  Pulse 79  Temp(Src) 98.2 F (36.8 C) (Oral)  Resp 16  Ht 5\' 9"  (1.753 m)  Wt 242 lb (109.77 kg)  BMI 35.72 kg/m2  SpO2 95% Nursing note and vital signs reviewed.  Physical Exam  Constitutional: He is oriented to person, place, and time. He appears well-developed and well-nourished. No distress.  Cardiovascular: Normal rate, regular rhythm, normal heart sounds and intact distal pulses.   Pulmonary/Chest: Effort normal and breath sounds normal.  Neurological: He is alert and oriented to person, place, and time.  Skin: Skin is warm and dry.  Psychiatric: He has a normal mood and affect. His behavior is normal. Judgment and thought content normal.       Assessment & Plan:  Problem List Items Addressed This Visit      Genitourinary   Erectile dysfunction - Primary    Symptoms and exam consistent with erectile dysfunction that is most likely multifactorial. Is scheduled to have a vasectomy within the next week. Start Viagra. Follow up for additional testing following vasectomy.       Relevant Medications   sildenafil (VIAGRA) 100 MG tablet

## 2015-10-09 NOTE — Assessment & Plan Note (Signed)
Symptoms and exam consistent with erectile dysfunction that is most likely multifactorial. Is scheduled to have a vasectomy within the next week. Start Viagra. Follow up for additional testing following vasectomy.

## 2015-10-09 NOTE — Progress Notes (Signed)
Pre visit review using our clinic review tool, if applicable. No additional management support is needed unless otherwise documented below in the visit note. 

## 2015-10-09 NOTE — Patient Instructions (Addendum)
Thank you for choosing [facility].  Summary/Instructions:  Your prescription(s) have been submitted to your pharmacy or been printed and provided for you. Please take as directed and contact our office if you believe you are having problem(s) with the medication(s) or have any questions.  If your symptoms worsen or fail to improve, please contact our office for further instruction, or in case of emergency go directly to the emergency room at the closest medical facility.    Erectile Dysfunction Erectile dysfunction is the inability to get or sustain a good enough erection to have sexual intercourse. Erectile dysfunction may involve:  Inability to get an erection.  Lack of enough hardness to allow penetration.  Loss of the erection before sex is finished.  Premature ejaculation. CAUSES  Certain drugs, such as:  Pain relievers.  Antihistamines.  Antidepressants.  Blood pressure medicines.  Water pills (diuretics).  Ulcer medicines.  Muscle relaxants.  Illegal drugs.  Excessive drinking.  Psychological causes, such as:  Anxiety.  Depression.  Sadness.  Exhaustion.  Performance fear.  Stress.  Physical causes, such as:  Artery problems. This may include diabetes, smoking, liver disease, or atherosclerosis.  High blood pressure.  Hormonal problems, such as low testosterone.  Obesity.  Nerve problems. This may include back or pelvic injuries, diabetes mellitus, multiple sclerosis, or Parkinson disease. SYMPTOMS  Inability to get an erection.  Lack of enough hardness to allow penetration.  Loss of the erection before sex is finished.  Premature ejaculation.  Normal erections at some times, but with frequent unsatisfactory episodes.  Orgasms that are not satisfactory in sensation or frequency.  Low sexual satisfaction in either partner because of erection problems.  A curved penis occurring with erection. The curve may cause pain or  may be too curved to allow for intercourse.  Never having nighttime erections. DIAGNOSIS Your caregiver can often diagnose this condition by:  Performing a physical exam to find other diseases or specific problems with the penis.  Asking you detailed questions about the problem.  Performing blood tests to check for diabetes mellitus or to measure hormone levels.  Performing urine tests to find other underlying health conditions.  Performing an ultrasound exam to check for scarring.  Performing a test to check blood flow to the penis.  Doing a sleep study at home to measure nighttime erections. TREATMENT   You may be prescribed medicines by mouth.  You may be given medicine injections into the penis.  You may be prescribed a vacuum pump with a ring.  Penile implant surgery may be performed. You may receive:  An inflatable implant.  A semirigid implant.  Blood vessel surgery may be performed. HOME CARE INSTRUCTIONS  If you are prescribed oral medicine, you should take the medicine as prescribed. Do not increase the dosage without first discussing it with your physician.  If you are using self-injections, be careful to avoid any veins that are on the surface of the penis. Apply pressure to the injection site for 5 minutes.  If you are using a vacuum pump, make sure you have read the instructions before using it. Discuss any questions with your physician before taking the pump home. SEEK MEDICAL CARE IF:  You experience pain that is not responsive to the pain medicine you have been prescribed.  You experience nausea or vomiting. SEEK IMMEDIATE MEDICAL CARE IF:   When taking oral or injectable medications, you experience an erection that lasts longer than 4 hours. If your physician is unavailable, go to  the nearest emergency room for evaluation. An erection that lasts much longer than 4 hours can result in permanent damage to your penis.  You have pain that is  severe.  You develop redness, severe pain, or severe swelling of your penis.  You have redness spreading up into your groin or lower abdomen.  You are unable to pass your urine.   This information is not intended to replace advice given to you by your health care provider. Make sure you discuss any questions you have with your health care provider.   Document Released: 09/04/2000 Document Revised: 05/10/2013 Document Reviewed: 02/09/2013 Elsevier Interactive Patient Education [employer].

## 2016-01-15 ENCOUNTER — Ambulatory Visit (INDEPENDENT_AMBULATORY_CARE_PROVIDER_SITE_OTHER): Payer: Commercial Managed Care - PPO | Admitting: Family

## 2016-01-15 ENCOUNTER — Encounter: Payer: Self-pay | Admitting: Family

## 2016-01-15 VITALS — BP 108/80 | HR 75 | Temp 98.0°F | Resp 16 | Ht 69.0 in | Wt 246.0 lb

## 2016-01-15 DIAGNOSIS — D1720 Benign lipomatous neoplasm of skin and subcutaneous tissue of unspecified limb: Secondary | ICD-10-CM | POA: Diagnosis not present

## 2016-01-15 DIAGNOSIS — Z23 Encounter for immunization: Secondary | ICD-10-CM

## 2016-01-15 NOTE — Progress Notes (Signed)
   Subjective:    Patient ID: Trevor Ramos, male    DOB: 1971-02-21, 45 y.o.   MRN: [id number]  Chief Complaint  Patient presents with  . Shoulder mass    has a place on his right shoulder that he would like removed, like excess skin, aware it will take surgery    HPI:  Trevor Ramos is a 45 y.o. male who  has a past medical history of GERD (gastroesophageal reflux disease). and presents today for an office visit.  This is a new problem. Associated symptom of a patch of skin located behind his right shoulder has been going on for a couple of years. Course has changed in size. No tenderness. Previously evaluated and informed it was not anything that needs to be worried about and follow up as needed. Denies any modifying factors that make it better or worse. It does partially restrict range of motion.    No Known Allergies   No current outpatient prescriptions on file prior to visit.   No current facility-administered medications on file prior to visit.    Review of Systems  Constitutional: Negative for fever and chills.  Skin:       Positive for soft tissue mass      Objective:    BP 108/80 mmHg  Pulse 75  Temp(Src) 98 F (36.7 C) (Oral)  Resp 16  Ht 5\' 9"  (1.753 m)  Wt 246 lb (111.585 kg)  BMI 36.31 kg/m2  SpO2 96% Nursing note and vital signs reviewed.  Physical Exam  Constitutional: He is oriented to person, place, and time. He appears well-developed and well-nourished. No distress.  Cardiovascular: Normal rate, regular rhythm, normal heart sounds and intact distal pulses.   Pulmonary/Chest: Effort normal and breath sounds normal.  Neurological: He is alert and oriented to person, place, and time.  Skin: Skin is warm and dry.     Psychiatric: He has a normal mood and affect. His behavior is normal. Judgment and thought content normal.       Assessment & Plan:   Problem List Items Addressed This Visit      Other   Lipoma of shoulder - Primary   Symptoms and exam consistent with lipoma and most likely benign. Refer to general surgery secondary to slightly restricted range of motion due to the lipoma. Follow-up if symptoms worsen or fail to improve.      Relevant Orders   Ambulatory referral to General Surgery      I have discontinued Mr. Portell's sildenafil.    Follow-up: No Follow-up on file.  Mauricio Po, FNP

## 2016-01-15 NOTE — Assessment & Plan Note (Signed)
Symptoms and exam consistent with lipoma and most likely benign. Refer to general surgery secondary to slightly restricted range of motion due to the lipoma. Follow-up if symptoms worsen or fail to improve.

## 2016-01-15 NOTE — Progress Notes (Signed)
Pre visit review using our clinic review tool, if applicable. No additional management support is needed unless otherwise documented below in the visit note. 

## 2016-01-15 NOTE — Addendum Note (Signed)
Addended by: Delice Bison E on: 01/15/2016 01:55 PM   Modules accepted: Orders

## 2016-01-15 NOTE — Patient Instructions (Signed)
Thank you for choosing [facility].  Summary/Instructions:  Referral to general surgery has been placed.   Lipoma A lipoma is a noncancerous (benign) tumor that is made up of fat cells. This is a very common type of soft-tissue growth. Lipomas are usually found under the skin (subcutaneous). They may occur in any tissue of the body that contains fat. Common areas for lipomas to appear include the back, shoulders, buttocks, and thighs. Lipomas grow slowly, and they are usually painless. Most lipomas do not cause problems and do not require treatment. CAUSES The cause of this condition is not known. RISK FACTORS This condition is more likely to develop in:  People who are 10-48 years old.  People who have a family history of lipomas. SYMPTOMS A lipoma usually appears as a small, round bump under the skin. It may feel soft or rubbery, but the firmness can vary. Most lipomas are not painful. However, a lipoma may become painful if it is located in an area where it pushes on nerves. DIAGNOSIS A lipoma can usually be diagnosed with a physical exam. You may also have tests to confirm the diagnosis and to rule out other conditions. Tests may include:  Imaging tests, such as a CT scan or MRI.  Removal of a tissue sample to be looked at under a microscope (biopsy). TREATMENT Treatment is not needed for small lipomas that are not causing problems. If a lipoma continues to get bigger or it causes problems, removal is often the best option. Lipomas can also be removed to improve appearance. Removal of a lipoma is usually done with a surgery in which the fatty cells and the surrounding capsule are removed. Most often, a medicine that numbs the area (local anesthetic) is used for this procedure. HOME CARE INSTRUCTIONS  Keep all follow-up visits as directed by your health care provider. This is important. SEEK MEDICAL CARE IF:  Your lipoma becomes larger or hard.  Your lipoma becomes painful,  red, or increasingly swollen. These could be signs of infection or a more serious condition.   This information is not intended to replace advice given to you by your health care provider. Make sure you discuss any questions you have with your health care provider.   Document Released: 08/28/2002 Document Revised: 01/22/2015 Document Reviewed: 09/03/2014 Elsevier Interactive Patient Education [employer].

## 2016-04-22 ENCOUNTER — Ambulatory Visit: Payer: Commercial Managed Care - PPO | Admitting: Podiatry

## 2016-04-24 ENCOUNTER — Ambulatory Visit: Payer: Commercial Managed Care - PPO | Admitting: Podiatry

## 2016-05-05 ENCOUNTER — Encounter: Payer: Self-pay | Admitting: Family

## 2016-05-05 ENCOUNTER — Ambulatory Visit (INDEPENDENT_AMBULATORY_CARE_PROVIDER_SITE_OTHER): Payer: Commercial Managed Care - PPO | Admitting: Family

## 2016-05-05 DIAGNOSIS — Z202 Contact with and (suspected) exposure to infections with a predominantly sexual mode of transmission: Secondary | ICD-10-CM | POA: Diagnosis not present

## 2016-05-05 MED ORDER — METRONIDAZOLE 500 MG PO TABS: 1000.0000 mg | ORAL_TABLET | Freq: Two times a day (BID) | ORAL | 0 refills | Status: DC

## 2016-05-05 NOTE — Progress Notes (Signed)
   Subjective:    Patient ID: Trevor Ramos, male    DOB: 18-Dec-1970, 45 y.o.   MRN: [id number]  Chief Complaint  Patient presents with  . Exposure to STD    states one of his friends had trich and he wants to be treated, does not want to know if he has it    HPI:  Trevor Ramos is a 45 y.o. male who  has a past medical history of GERD (gastroesophageal reflux disease). and presents today for an acute office visit.   This is a new problem. Recently had exposure to a partner that was diagnosed with trichomonas. Denies any current symptoms including discharge, pain, or dysuria. No fevers.  No Known Allergies   No current outpatient prescriptions on file prior to visit.   No current facility-administered medications on file prior to visit.     Review of Systems  Constitutional: Negative for chills and fever.  Genitourinary: Negative for discharge, dysuria, frequency, hematuria, penile swelling, scrotal swelling and urgency.      Objective:    BP 126/82 (BP Location: Left Arm, Patient Position: Sitting, Cuff Size: Large)   Pulse 81   Temp 98.5 F (36.9 C) (Oral)   Resp 16   Ht 5\' 9"  (1.753 m)   Wt 238 lb (108 kg)   SpO2 98%   BMI 35.15 kg/m  Nursing note and vital signs reviewed.  Physical Exam  Constitutional: He is oriented to person, place, and time. He appears well-developed and well-nourished. No distress.  Cardiovascular: Normal rate, regular rhythm, normal heart sounds and intact distal pulses.   Pulmonary/Chest: Effort normal and breath sounds normal.  Neurological: He is alert and oriented to person, place, and time.  Skin: Skin is warm and dry.  Psychiatric: He has a normal mood and affect. His behavior is normal. Judgment and thought content normal.       Assessment & Plan:   Problem List Items Addressed This Visit      Other   Exposure to sexually transmitted disease (STD)    Partner recently diagnosed with Trichomonas. No symptoms present upon  exam or in history. Start metronidazole. Follow-up if symptoms develop or worsen.      Relevant Medications   metroNIDAZOLE (FLAGYL) 500 MG tablet    Other Visit Diagnoses   None.      I am having Mr. Lapka start on metroNIDAZOLE.   Meds ordered this encounter  Medications  . metroNIDAZOLE (FLAGYL) 500 MG tablet    Sig: Take 2 tablets (1,000 mg total) by mouth 2 (two) times daily.    Dispense:  4 tablet    Refill:  0    Order Specific Question:   Supervising Provider    Answer:   Pricilla Holm A [id number]     Follow-up: Return if symptoms worsen or fail to improve.  Mauricio Po, FNP

## 2016-05-05 NOTE — Patient Instructions (Signed)
Thank you for choosing [facility].  Summary/Instructions:  Your prescription(s) have been submitted to your pharmacy or been printed and provided for you. Please take as directed and contact our office if you believe you are having problem(s) with the medication(s) or have any questions.  If your symptoms worsen or fail to improve, please contact our office for further instruction, or in case of emergency go directly to the emergency room at the closest medical facility.    Safe Sex Safe sex is about reducing the risk of giving or getting a sexually transmitted disease (STD). STDs are spread through sexual contact involving the genitals, mouth, or rectum. Some STDs can be cured and others cannot. Safe sex can also prevent unintended pregnancies.  WHAT ARE SOME SAFE SEX PRACTICES?  Limit your sexual activity to only one partner who is having sex with only you.  Talk to your partner about his or her past partners, past STDs, and drug use.  Use a condom every time you have sexual intercourse. This includes vaginal, oral, and anal sexual activity. Both females and males should wear condoms during oral sex. Only use latex or polyurethane condoms and water-based lubricants. Using petroleum-based lubricants or oils to lubricate a condom will weaken the condom and increase the chance that it will break. The condom should be in place from the beginning to the end of sexual activity. Wearing a condom reduces, but does not completely eliminate, your risk of getting or giving an STD. STDs can be spread by contact with infected body fluids and skin.  Get vaccinated for hepatitis B and HPV.  Avoid alcohol and recreational drugs, which can affect your judgment. You may forget to use a condom or participate in high-risk sex.  For females, avoid douching after sexual intercourse. Douching can spread an infection farther into the reproductive tract.  Check your body for signs of sores, blisters,  rashes, or unusual discharge. See your health care provider if you notice any of these signs.  Avoid sexual contact if you have symptoms of an infection or are being treated for an STD. If you or your partner has herpes, avoid sexual contact when blisters are present. Use condoms at all other times.  If you are at risk of being infected with HIV, it is recommended that you take a prescription medicine daily to prevent HIV infection. This is called pre-exposure prophylaxis (PrEP). You are considered at risk if:  You are a man who has sex with other men (MSM).  You are a heterosexual man or woman who is sexually active with more than one partner.  You take drugs by injection.  You are sexually active with a partner who has HIV.  Talk with your health care provider about whether you are at high risk of being infected with HIV. If you choose to begin PrEP, you should first be tested for HIV. You should then be tested every 3 months for as long as you are taking PrEP.  See your health care provider for regular screenings, exams, and tests for other STDs. Before having sex with a new partner, each of you should be screened for STDs and should talk about the results with each other. WHAT ARE THE BENEFITS OF SAFE SEX?   There is less chance of getting or giving an STD.  You can prevent unwanted or unintended pregnancies.  By discussing safe sex concerns with your partner, you may increase feelings of intimacy, comfort, trust, and honesty between the two of  you.   This information is not intended to replace advice given to you by your health care provider. Make sure you discuss any questions you have with your health care provider.   Document Released: 10/15/2004 Document Revised: 09/28/2014 Document Reviewed: 02/29/2012 Elsevier Interactive Patient Education [employer].

## 2016-05-05 NOTE — Assessment & Plan Note (Signed)
Partner recently diagnosed with Trichomonas. No symptoms present upon exam or in history. Start metronidazole. Follow-up if symptoms develop or worsen.

## 2016-06-01 ENCOUNTER — Encounter (HOSPITAL_COMMUNITY): Payer: Self-pay | Admitting: *Deleted

## 2016-06-01 ENCOUNTER — Emergency Department (HOSPITAL_COMMUNITY): Payer: Commercial Managed Care - PPO

## 2016-06-01 ENCOUNTER — Telehealth: Payer: Self-pay | Admitting: Family

## 2016-06-01 ENCOUNTER — Emergency Department (HOSPITAL_COMMUNITY)
Admission: EM | Admit: 2016-06-01 | Discharge: 2016-06-01 | Disposition: A | Discharge status: Home / Self Care | Payer: Commercial Managed Care - PPO | Attending: Emergency Medicine | Admitting: Emergency Medicine

## 2016-06-01 DIAGNOSIS — M545 Low back pain: Secondary | ICD-10-CM | POA: Diagnosis present

## 2016-06-01 HISTORY — DX: Other chronic pain: G89.29

## 2016-06-01 HISTORY — DX: Dorsalgia, unspecified: M54.9

## 2016-06-01 LAB — URINALYSIS, ROUTINE W REFLEX MICROSCOPIC
Bilirubin Urine: NEGATIVE
GLUCOSE, UA: NEGATIVE mg/dL
Ketones, ur: 15 mg/dL — AB
LEUKOCYTES UA: NEGATIVE
Nitrite: NEGATIVE
PH: 6 (ref 5.0–8.0)
Protein, ur: NEGATIVE mg/dL
Specific Gravity, Urine: 1.026 (ref 1.005–1.030)

## 2016-06-01 LAB — URINE MICROSCOPIC-ADD ON

## 2016-06-01 MED ORDER — HYDROCODONE-ACETAMINOPHEN 5-325 MG PO TABS
1.0000 | ORAL_TABLET | Freq: Once | ORAL | Status: AC
  Administered 2016-06-01: 1 via ORAL
  Filled 2016-06-01: qty 1

## 2016-06-01 MED ORDER — HYDROCODONE-ACETAMINOPHEN 5-325 MG PO TABS: 1.0000 | ORAL_TABLET | Freq: Four times a day (QID) | ORAL | 0 refills | Status: DC | PRN

## 2016-06-01 MED ORDER — NAPROXEN 500 MG PO TABS: 500.0000 mg | ORAL_TABLET | Freq: Two times a day (BID) | ORAL | 0 refills | Status: DC | PRN

## 2016-06-01 MED ORDER — METHOCARBAMOL 500 MG PO TABS: 500.0000 mg | ORAL_TABLET | Freq: Two times a day (BID) | ORAL | 0 refills | Status: DC | PRN

## 2016-06-01 NOTE — Discharge Instructions (Signed)
Naproxen as needed for mild to moderate pain. Norco as needed for severe pain - This can make you very drowsy - please do not drink alcohol, operate heavy machinery or drive on this medication.   Back Pain:  Your back pain should be treated with medicines such as ibuprofen or aleve and this back pain should get better over the next 2 weeks.  However if you develop severe or worsening pain, low back pain with fever, numbness, weakness or inability to walk or urinate, you should return to the ER immediately.  Please follow up with your doctor this week for a recheck if still having symptoms.  Low back pain is discomfort in the lower back that may be due to injuries to muscles and ligaments around the spine. Occasionally, it may be caused by a a problem to a part of the spine called a disc. The pain may last several days or a week;  However, most patients get completely well in 4 weeks.  COLD THERAPY DIRECTIONS:  Ice or gel packs can be used to reduce both pain and swelling. Ice is the most helpful within the first 24 to 48 hours after an injury or flareup from overusing a muscle or joint.  Ice is effective, has very few side effects, and is safe for most people to use.   If you expose your skin to cold temperatures for too long or without the proper protection, you can damage your skin or nerves. Watch for signs of skin damage due to cold.   HOME CARE INSTRUCTIONS  Follow these tips to use ice and cold packs safely.  Place a dry or damp towel between the ice and skin. A damp towel will cool the skin more quickly, so you may need to shorten the time that the ice is used.  For a more rapid response, add gentle compression to the ice.  Ice for no more than 10 to 20 minutes at a time. The bonier the area you are icing, the less time it will take to get the benefits of ice.  Check your skin after 5 minutes to make sure there are no signs of a poor response to cold or skin damage.  Rest 20 minutes or more  in between uses.  Once your skin is numb, you can end your treatment. You can test numbness by very lightly touching your skin. The touch should be so light that you do not see the skin dimple from the pressure of your fingertip. When using ice, most people will feel these normal sensations in this order: cold, burning, aching, and numbness.   You will need to follow up with  Your primary healthcare provider in 1-2 weeks for reassessment.  Be aware that if you develop new symptoms, such as a fever, leg weakness, difficulty with or loss of control of your urine or bowels, abdominal pain, or more severe pain, you will need to seek medical attention and  / or return to the Emergency department.

## 2016-06-01 NOTE — ED Provider Notes (Signed)
[facility] DEPT Provider Note   CSN: [id number] Arrival date & time: 06/01/16  1136  By signing my name below, I, Shanna Cisco, attest that this documentation has been prepared under the direction and in the presence of [facility], [place]. Electronically signed by: Shanna Cisco, ED Scribe. 06/01/16. 1:30 PM.  History   Chief Complaint Chief Complaint  Patient presents with  . Back Pain   The history is provided by the patient and medical records. No language interpreter was used.   HPI Comments:  Trevor Ramos is a 45 y.o. male with PMHx of chronic upper back pain who presents to the Emergency Department complaining of constant lower back pain, which started 1 day ago. He reports that lower back pain is new and is characterized as a burning sensation. He rates pain 8/10. Pain radiates from lower back to knees and wraps around lower abdomen bilaterally. Associated symptoms include tingling and numbness in anterior thighs and difficulty walking due to pain and weakness. Pain aggravated with movement. He took Ibuprofen and rested yesterday without relief. Denies fever, chest pain, headache, injury, central abdominal pain, loss of control of bladder and bowels, saddle anesthesia, urinary frequency, penile discharge, rashes or swelling.  Past Medical History:  Diagnosis Date  . Chronic back pain   . GERD (gastroesophageal reflux disease)     Patient Active Problem List   Diagnosis Date Noted  . Exposure to sexually transmitted disease (STD) 05/05/2016  . Lipoma of shoulder 01/15/2016  . Erectile dysfunction 10/09/2015    History reviewed. No pertinent surgical history.     Home Medications    Prior to Admission medications   Medication Sig Start Date End Date Taking? Authorizing Provider  HYDROcodone-acetaminophen (NORCO) 5-325 MG tablet Take 1 tablet by mouth every 6 (six) hours as needed for moderate pain. 06/01/16   Ozella Almond Anaih Brander, PA-C  methocarbamol (ROBAXIN) 500 MG  tablet Take 1 tablet (500 mg total) by mouth 2 (two) times daily as needed for muscle spasms. 06/01/16   Aesha Agrawal Pilcher Marlenne Ridge, PA-C  metroNIDAZOLE (FLAGYL) 500 MG tablet Take 2 tablets (1,000 mg total) by mouth 2 (two) times daily. 05/05/16   Golden Circle, FNP  naproxen (NAPROSYN) 500 MG tablet Take 1 tablet (500 mg total) by mouth 2 (two) times daily as needed. 06/01/16   Hinckley, PA-C    Family History Family History  Problem Relation Age of Onset  . Healthy Mother   . Healthy Father     Social History Social History  Substance Use Topics  . Smoking status: Never Smoker  . Smokeless tobacco: Never Used  . Alcohol use Yes     Comment: occ     Allergies   Review of patient's allergies indicates no known allergies.   Review of Systems Review of Systems  Constitutional: Negative for fever.  Gastrointestinal: Negative for abdominal pain, diarrhea, nausea and vomiting.  Genitourinary: Negative for discharge, dysuria and frequency.  Musculoskeletal: Positive for back pain. Negative for joint swelling.  Skin: Negative for rash.  Neurological: Positive for weakness and numbness.    Physical Exam Updated Vital Signs BP 149/87   Pulse 73   Temp 98.4 F (36.9 C) (Oral)   Resp 18   Ht 5\' 9"  (1.753 m)   Wt 104.3 kg   SpO2 100%   BMI 33.97 kg/m   Physical Exam  Constitutional: He is oriented to person, place, and time. He appears well-developed and well-nourished. No distress.  Neck:  Full ROM without pain  No midline tenderness  No tenderness of paraspinal musculature  Cardiovascular: Normal rate, regular rhythm, normal heart sounds and intact distal pulses.  Exam reveals no gallop and no friction rub.   No murmur heard. Pulmonary/Chest: Effort normal and breath sounds normal. No respiratory distress. He has no wheezes. He has no rales.  Abdominal: Soft. Bowel sounds are normal. He exhibits no distension. There is no tenderness.  No CVA or flank tenderness.    Musculoskeletal:       Arms: Ppatient is able to ambulate without difficulty.  No noted deformities or signs of inflammation. No overlying skin changes. TTP as depicted in image: paraspinal > midline. Straight leg raises are positive bilaterally for radicular symptoms. 5/5 muscle strength of bilateral LE's   Neurological: He is alert and oriented to person, place, and time. He has normal reflexes.  Bilateral lower extremities neurovascularly intact. Sensation intact and equal of BLE's.   Skin: Skin is warm and dry. No rash noted. No erythema.  Nursing note and vitals reviewed.    ED Treatments / Results  DIAGNOSTIC STUDIES:  Oxygen Saturation is 96% on room air, normal by my interpretation.    COORDINATION OF CARE:  1:24 PM Discussed treatment plan with pt at bedside and pt agreed to plan.  Labs (all labs ordered are listed, but only abnormal results are displayed) Labs Reviewed  URINALYSIS, ROUTINE W REFLEX MICROSCOPIC (NOT AT [facility]) - Abnormal; Notable for the following:       Result Value   Hgb urine dipstick SMALL (*)    Ketones, ur 15 (*)    All other components within normal limits  URINE MICROSCOPIC-ADD ON - Abnormal; Notable for the following:    Squamous Epithelial / LPF 0-5 (*)    Bacteria, UA RARE (*)    Crystals CA OXALATE CRYSTALS (*)    All other components within normal limits    EKG  EKG Interpretation None       Radiology Dg Lumbar Spine Complete  Result Date: 06/01/2016 CLINICAL DATA:  Low back pain with radiation down both lower extremities 2 days. EXAM: LUMBAR SPINE - COMPLETE 4+ VIEW COMPARISON:  None. FINDINGS: There is no evidence of lumbar spine fracture. Alignment is normal. Intervertebral disc spaces are maintained. IMPRESSION: Negative. Electronically Signed   By: Marin Olp M.D.   On: 06/01/2016 14:32    Procedures Procedures (including critical care time)  Medications Ordered in ED Medications  HYDROcodone-acetaminophen  (NORCO/VICODIN) 5-325 MG per tablet 1 tablet (1 tablet Oral Given 06/01/16 1459)     Initial Impression / Assessment and Plan / ED Course  I have reviewed the triage vital signs and the nursing notes.  Pertinent labs & imaging results that were available during my care of the patient were reviewed by me and considered in my medical decision making (see chart for details).  Clinical Course   Trevor Ramos presents with back pain. Patient endorses weakness when ambulating, however demonstrates no lower extremity weakness on exam. He is ambulatory in ED and has 5/5 muscle strength of bilateral lower extremities. No focal neuro deficits. He denies saddle anesthesia or bowel/bladder incontinence. Lumbar x-ray unremarkable. UA obtained with no sign of infection. No fevers or other infectious symptoms to suggest that the patient's back pain is due to an infection. Lower extremities are neurovascularly intact. I have reviewed return precautions, including the development of any of these signs or symptoms, and the patient has voiced understanding. I reviewed  supportive care instructions, including NSAIDs and PCP follow-up if symptoms do not improve. Patient voiced understanding and agreement with plan.    Final Clinical Impressions(s) / ED Diagnoses   Final diagnoses:  Bilateral low back pain, with sciatica presence unspecified    New Prescriptions Discharge Medication List as of 06/01/2016  4:05 PM    START taking these medications   Details  HYDROcodone-acetaminophen (NORCO) 5-325 MG tablet Take 1 tablet by mouth every 6 (six) hours as needed for moderate pain., Starting Mon 06/01/2016, Print    methocarbamol (ROBAXIN) 500 MG tablet Take 1 tablet (500 mg total) by mouth 2 (two) times daily as needed for muscle spasms., Starting Mon 06/01/2016, Print    naproxen (NAPROSYN) 500 MG tablet Take 1 tablet (500 mg total) by mouth 2 (two) times daily as needed., Starting Mon 06/01/2016, Print       I  personally performed the services described in this documentation, which was scribed in my presence. The recorded information has been reviewed and is accurate.     [facility] Coltrane Tugwell, PA-C 06/01/16 1639    Nat Christen, MD 06/02/16 [phone]

## 2016-06-01 NOTE — Telephone Encounter (Signed)
Patient Name: Trevor Ramos  DOB: [birth year]    Initial Comment Caller states he is having lower back pain, moving down his leg. Trouble walking. Numbness in both his legs.   Nurse Assessment  Nurse: Verlin Fester RN, Stanton Kidney Date/Time (Eastern Time): 06/01/2016 10:41:35 AM  Confirm and document reason for call. If symptomatic, describe symptoms. You must click the next button to save text entered. ---Patient states he has states he is having lower back pain, moving down his leg. Trouble walking. Numbness in both his legs.  Has the patient traveled out of the country within the last 30 days? ---No  Does the patient have any new or worsening symptoms? ---Yes  Will a triage be completed? ---Yes  Related visit to physician within the last 2 weeks? ---No  Does the PT have any chronic conditions? (i.e. diabetes, asthma, etc.) ---No  Is this a behavioral health or substance abuse call? ---No     Guidelines    Guideline Title Affirmed Question Affirmed Notes  Back Pain Weakness of a leg or foot (e.g., unable to bear weight, dragging foot)    Final Disposition User   Go to ED Now (or PCP triage) Verlin Fester, RN, [facility] - ED   Disagree/Comply: Comply

## 2016-06-01 NOTE — ED Triage Notes (Signed)
Pt states hx of chronic lower back pain.  However, yesterday the pain started and has not left.  Also c/o tingling to anterior thighs.  Denies loss of bowel or bladder habits.

## 2016-06-26 ENCOUNTER — Ambulatory Visit (INDEPENDENT_AMBULATORY_CARE_PROVIDER_SITE_OTHER): Payer: Commercial Managed Care - PPO | Admitting: Nurse Practitioner

## 2016-06-26 ENCOUNTER — Encounter: Payer: Self-pay | Admitting: Nurse Practitioner

## 2016-06-26 ENCOUNTER — Other Ambulatory Visit: Payer: Commercial Managed Care - PPO

## 2016-06-26 VITALS — BP 146/68 | HR 88 | Ht 69.0 in | Wt 235.0 lb

## 2016-06-26 DIAGNOSIS — Z202 Contact with and (suspected) exposure to infections with a predominantly sexual mode of transmission: Secondary | ICD-10-CM

## 2016-06-26 DIAGNOSIS — R188 Other ascites: Secondary | ICD-10-CM

## 2016-06-26 DIAGNOSIS — R8290 Unspecified abnormal findings in urine: Secondary | ICD-10-CM

## 2016-06-26 NOTE — Progress Notes (Signed)
   Subjective:  Patient ID: Trevor Ramos, male    DOB: 1971-05-19  Age: 45 y.o. MRN: [id number]  CC: Exposure to STD (Pt stated need recheck for STD.)  HPI He is concerned that he might have been exposed to [place] by his girlfriend. She was previously treated, but symptoms persisted, she was retested and treated. He denies any urinary symptoms at this time. He agrees to be tested since he was only treated empirically last month.  Outpatient Medications Prior to Visit  Medication Sig Dispense Refill  . methocarbamol (ROBAXIN) 500 MG tablet Take 1 tablet (500 mg total) by mouth 2 (two) times daily as needed for muscle spasms. 12 tablet 0  . metroNIDAZOLE (FLAGYL) 500 MG tablet Take 2 tablets (1,000 mg total) by mouth 2 (two) times daily. 4 tablet 0  . naproxen (NAPROSYN) 500 MG tablet Take 1 tablet (500 mg total) by mouth 2 (two) times daily as needed. 30 tablet 0  . HYDROcodone-acetaminophen (NORCO) 5-325 MG tablet Take 1 tablet by mouth every 6 (six) hours as needed for moderate pain. (Patient not taking: Reported on 06/26/2016) 5 tablet 0   No facility-administered medications prior to visit.     ROS See HPI  Objective:  BP (!) 146/68   Pulse 88   Ht 5\' 9"  (1.753 m)   Wt 235 lb (106.6 kg)   BMI 34.70 kg/m   BP Readings from Last 3 Encounters:  06/26/16 (!) 146/68  06/01/16 149/87  05/05/16 126/82    Wt Readings from Last 3 Encounters:  06/26/16 235 lb (106.6 kg)  06/01/16 230 lb (104.3 kg)  05/05/16 238 lb (108 kg)    Physical Exam  Constitutional: He is oriented to person, place, and time. No distress.  Neck: Normal range of motion. Neck supple.  Cardiovascular: Normal rate.   Pulmonary/Chest: Effort normal.  Neurological: He is alert and oriented to person, place, and time.  Vitals reviewed.   Lab Results  Component Value Date   WBC 8.3 01/12/2015   HGB 13.7 01/12/2015   HCT 41.7 01/12/2015   PLT 257 01/12/2015   GLUCOSE 99 01/12/2015   NA 136  01/12/2015   K 4.0 01/12/2015   CL 105 01/12/2015   CREATININE 0.88 01/12/2015   BUN 13 01/12/2015   CO2 23 01/12/2015   INR 0.97 01/12/2015    Assessment & Plan:   Leaman was seen today for exposure to std.  Diagnoses and all orders for this visit:  Exposure to sexually transmitted disease (STD) -     Cancel: POCT urinalysis dipstick -     Chlamydia/Gonococcus/Trichomonas, NAA; Future   I am having Mr. Adderly maintain his metroNIDAZOLE, naproxen, methocarbamol, and HYDROcodone-acetaminophen.  No orders of the defined types were placed in this encounter.   Follow-up: Return if symptoms worsen or fail to improve.  Wilfred Lacy, NP

## 2016-06-26 NOTE — Patient Instructions (Signed)
Will call with lab results. Advised to abstain from any sexual activity, to obtain some lab results.

## 2016-06-30 ENCOUNTER — Telehealth: Payer: Self-pay | Admitting: Nurse Practitioner

## 2016-06-30 NOTE — Telephone Encounter (Signed)
Labs were originally ordered by [facility] thinking [facility] could result lab, but with order having trich component, [facility] could no longer result this lab and it wud need to go to [facility]----per [place], lab called [place] to change/order correctly to avoid having test sent to 2 different labs, but order was never changed in patient's chart---a lab will result from [facility] on Thursday, not sure if they are actually resulting the correct lab---I have advised patient that I will call him back on Thursday either way, either with result or with new date for results to be released---I am following up with cynthia at lab on wed (10/11)am----routing to myself to be in my inbasket

## 2016-06-30 NOTE — Telephone Encounter (Signed)
Patient is calling about his lab results. I had talked to Jonelle Sidle she is going to be following up with the lab on where the results are.

## 2016-07-03 NOTE — Telephone Encounter (Signed)
I have advised patient that I still am not showing a result for his labwork from [facility]---I have emailed cynthia/lab again to see if she can reach out to [facility] to get this result----advised patient I will call him back when we get this

## 2016-07-04 LAB — CHLAMYDIA/GONOCOCCUS/TRICHOMONAS, NAA
Chlamydia by NAA: NEGATIVE
Gonococcus by NAA: NEGATIVE
Trich vag by NAA: NEGATIVE

## 2016-07-06 ENCOUNTER — Telehealth: Payer: Self-pay | Admitting: *Deleted

## 2016-07-06 NOTE — Telephone Encounter (Signed)
Called Pt. About the lab result : negative for gonorrhea, chlamydia, and trichmoniasis

## 2016-07-06 NOTE — Telephone Encounter (Signed)
Called pt.

## 2016-12-03 ENCOUNTER — Ambulatory Visit: Payer: Commercial Managed Care - PPO | Admitting: Family

## 2016-12-08 ENCOUNTER — Other Ambulatory Visit: Payer: Commercial Managed Care - PPO

## 2016-12-08 ENCOUNTER — Encounter: Payer: Self-pay | Admitting: Family

## 2016-12-08 ENCOUNTER — Ambulatory Visit (INDEPENDENT_AMBULATORY_CARE_PROVIDER_SITE_OTHER): Payer: Commercial Managed Care - PPO | Admitting: Family

## 2016-12-08 VITALS — BP 134/88 | HR 86 | Temp 98.1°F | Resp 16 | Ht 69.0 in | Wt 243.8 lb

## 2016-12-08 DIAGNOSIS — M5441 Lumbago with sciatica, right side: Secondary | ICD-10-CM | POA: Diagnosis not present

## 2016-12-08 DIAGNOSIS — Z202 Contact with and (suspected) exposure to infections with a predominantly sexual mode of transmission: Secondary | ICD-10-CM

## 2016-12-08 DIAGNOSIS — M5442 Lumbago with sciatica, left side: Secondary | ICD-10-CM

## 2016-12-08 MED ORDER — PREDNISONE 20 MG PO TABS: ORAL_TABLET | ORAL | 0 refills | Status: DC

## 2016-12-08 MED ORDER — TRAMADOL HCL 50 MG PO TABS: 50.0000 mg | ORAL_TABLET | Freq: Three times a day (TID) | ORAL | 0 refills | Status: DC | PRN

## 2016-12-08 MED ORDER — CLOTRIMAZOLE-BETAMETHASONE 1-0.05 % EX CREA: 1.0000 "application " | TOPICAL_CREAM | Freq: Two times a day (BID) | CUTANEOUS | 0 refills | Status: DC

## 2016-12-08 MED ORDER — IBUPROFEN-FAMOTIDINE 800-26.6 MG PO TABS: 1.0000 | ORAL_TABLET | Freq: Three times a day (TID) | ORAL | 1 refills | Status: DC | PRN

## 2016-12-08 MED ORDER — CYCLOBENZAPRINE HCL 10 MG PO TABS: 10.0000 mg | ORAL_TABLET | Freq: Three times a day (TID) | ORAL | 0 refills | Status: DC | PRN

## 2016-12-08 NOTE — Assessment & Plan Note (Signed)
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 Previous x-rays were negative. Start tramadol as needed for pain. Start prednisone taper and Duexis. Ice regimen and home exercise therapy. Consider physical therapy if symptoms worsen or do not improve in the next 3 weeks.

## 2016-12-08 NOTE — Progress Notes (Signed)
Subjective:    Patient ID: Trevor Ramos, male    DOB: 1970/12/18, 46 y.o.   MRN: [id number]  Chief Complaint  Patient presents with  . Back Pain    wants to have his back checked has been hurting since thursday and causes leg numbness, STD check    HPI:  Trevor Ramos is a 46 y.o. male who  has a past medical history of Chronic back pain and GERD (gastroesophageal reflux disease). and presents today for an office visit.  1.) STD check - Concern for exposure to STD with no current symptoms. Denies fevers, chills, rashes, urinary frequency, urgency, penile discharge or dysuria. No exposures that he is concerned about.   2.) Back pain - This is a new problem. Associated symptom of pain located in his lower back with radiating numbness that goes down his bilateral legs. 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 No sounds or sensations heard or felt. Symptoms have been going on for about 5 days. Previous history of back pain that he was evaluated in the ED about 6 months ago with x-rays being negative with normal alignment. Modifying factors include ibuprofen which has not helped very much.     No Known Allergies    Outpatient Medications Prior to Visit  Medication Sig Dispense Refill  . naproxen (NAPROSYN) 500 MG tablet Take 1 tablet (500 mg total) by mouth 2 (two) times daily as needed. 30 tablet 0  . HYDROcodone-acetaminophen (NORCO) 5-325 MG tablet Take 1 tablet by mouth every 6 (six) hours as needed for moderate pain. 5 tablet 0  . methocarbamol (ROBAXIN) 500 MG tablet Take 1 tablet (500 mg total) by mouth 2 (two) times daily as needed for muscle spasms. 12 tablet 0  . metroNIDAZOLE (FLAGYL) 500 MG tablet Take 2 tablets (1,000 mg total) by mouth 2 (two) times daily. 4 tablet 0   No facility-administered medications prior to visit.       No past surgical history on file.    Past  Medical History:  Diagnosis Date  . Chronic back pain   . GERD (gastroesophageal reflux disease)       Review of Systems  Constitutional: Negative for chills and fever.  Genitourinary: Negative for discharge, dysuria, flank pain, frequency, hematuria, penile pain, penile swelling, scrotal swelling, testicular pain and urgency.  Musculoskeletal: Positive for back pain. Negative for neck pain and neck stiffness.  Skin: Negative for rash.  Neurological: Positive for weakness and numbness.      Objective:    BP 134/88 (BP Location: Left Arm, Patient Position: Sitting, Cuff Size: Large)   Pulse 86   Temp 98.1 F (36.7 C) (Oral)   Resp 16   Ht 5\' 9"  (1.753 m)   Wt 243 lb 12.8 oz (110.6 kg)   SpO2 98%   BMI 36.00 kg/m  Nursing note and vital signs reviewed.  Physical Exam  Constitutional: He is oriented to person, place, and time. He appears well-developed and well-nourished. No distress.  Cardiovascular: Normal rate, regular rhythm, normal heart sounds and intact distal pulses.   Pulmonary/Chest: Effort normal and breath sounds normal.  Musculoskeletal:  Low back - no obvious deformity, discoloration, or edema. Palpable tenderness along spinous and transverse processes of L2-L5 as well as lumbar paraspinal musculature bilaterally. Standing flexion test positive on the right. Flexion limited secondary to pain. There is pain and all other motions.  Positive straight leg raise. Negative Faber's test. Distal pulses and sensation are intact and appropriate.  Neurological: He is alert and oriented to person, place, and time.  Skin: Skin is warm and dry. No rash noted.  Psychiatric: He has a normal mood and affect. His behavior is normal. Judgment and thought content normal.       Assessment & Plan:   Problem List Items Addressed This Visit      Other   Exposure to sexually transmitted disease (STD)    Obtain Chlamydia/gonorrhea,/trichomonas/NAA and HIV antibody. Discussed  importance of protection from sexual transmitted diseases. No current symptoms. Additional treatment and follow-up pending blood work results if indicated.      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 Previous x-rays were negative. Start tramadol as needed for pain. Start prednisone taper and Duexis. Ice regimen and home exercise therapy. Consider physical therapy if symptoms worsen or do not improve in the next 3 weeks.      Relevant Medications   cyclobenzaprine (FLEXERIL) 10 MG tablet   predniSONE (DELTASONE) 20 MG tablet   traMADol (ULTRAM) 50 MG tablet   Ibuprofen-Famotidine 800-26.6 MG TABS       I have discontinued Trevor Ramos's metroNIDAZOLE, methocarbamol, and HYDROcodone-acetaminophen. I am also having him start on cyclobenzaprine, predniSONE, traMADol, Ibuprofen-Famotidine, and clotrimazole-betamethasone. Additionally, I am having him maintain his naproxen.   Meds ordered this encounter  Medications  . cyclobenzaprine (FLEXERIL) 10 MG tablet    Sig: Take 1 tablet (10 mg total) by mouth 3 (three) times daily as needed for muscle spasms.    Dispense:  30 tablet    Refill:  0    Order Specific Question:   Supervising Provider    Answer:   Pricilla Holm A [[id number]]  . predniSONE (DELTASONE) 20 MG tablet    Sig: Take 3 tablets by mouth daily for 3 days, then 2 tablets by mouth daily for 3 days and 1 tablet by mouth daily for 3 days.    Dispense:  18 tablet    Refill:  0    Order Specific Question:   Supervising Provider     Answer:   Pricilla Holm A [[id number]]  . traMADol (ULTRAM) 50 MG tablet    Sig: Take 1 tablet (50 mg total) by mouth every 8 (eight) hours as needed.    Dispense:  30 tablet    Refill:  0    Order Specific Question:   Supervising Provider    Answer:   Pricilla Holm A [[id number]]  . Ibuprofen-Famotidine 800-26.6 MG TABS    Sig: Take 1 tablet by mouth 3 (three) times daily as needed.    Dispense:  90 tablet    Refill:  1    Order Specific Question:   Supervising Provider    Answer:   Pricilla Holm A [[id number]]  . clotrimazole-betamethasone (LOTRISONE) cream    Sig: Apply 1 application topically 2 (two) times daily.    Dispense:  30 g    Refill:  0    Order Specific Question:  Supervising Provider    Answer:   Pricilla Holm A [[id number]]     Follow-up: Return in about 3 weeks (around 12/29/2016), or if symptoms worsen or fail to improve.  Mauricio Po, FNP

## 2016-12-08 NOTE — Patient Instructions (Signed)
Thank you for choosing [facility]!  Ice / moist heat x 20 minutes every 2 hours and as needed or following activity  Exercises 1-2 times per day as instructed.    Medications  Duexis - 1 tablet 3 times per day for the next 5-7 days and then as needed.  Cyclobenzaprine as needed for muscle spasms.  Start prednisone for inflammation.  You will receive a call from [facility] regarding your Pennsaid/Duexis/Vimovo. The medication will be mailed to you and should cost you no more than $10 per item or possibly free depending upon your insurance.   Your prescription(s) have been submitted to your pharmacy or been printed and provided for you. Please take as directed and contact our office if you believe you are having problem(s) with the medication(s) or have any questions.  Lab  Please stop by the lab on the lower level of the building for your blood work. Your results will be released to [facility] (or called to you) after review, usually within 72 hours after test completion. If any changes need to be made, you will be notified at that same time.  1.) The lab is open from 7:30am to 5:30 pm Monday-Friday 2.) No appointment is necessary 3.) Fasting (if needed) is 6-8 hours after food and drink; black coffee and water are okay   If your symptoms worsen or fail to improve, please contact our office for further instruction, or in case of emergency go directly to the emergency room at the closest medical facility.    Low Back Sprain Rehab Ask your health care provider which exercises are safe for you. Do exercises exactly as told by your health care provider and adjust them as directed. It is normal to feel mild stretching, pulling, tightness, or discomfort as you do these exercises, but you should stop right away if you feel sudden pain or your pain gets worse. Do not begin these exercises until told by your health care provider. Stretching and range of motion exercises These  exercises warm up your muscles and joints and improve the movement and flexibility of your back. These exercises also help to relieve pain, numbness, and tingling. Exercise A: Lumbar rotation   1. Lie on your back on a firm surface and bend your knees. 2. Straighten your arms out to your sides so each arm forms an "L" shape with a side of your body (a 90 degree angle). 3. Slowly move both of your knees to one side of your body until you feel a stretch in your lower back. Try not to let your shoulders move off of the floor. 4. Hold for __________ seconds. 5. Tense your abdominal muscles and slowly move your knees back to the starting position. 6. Repeat this exercise on the other side of your body. Repeat __________ times. Complete this exercise __________ times a day. Exercise B: Prone extension on elbows   1. Lie on your abdomen on a firm surface. 2. Prop yourself up on your elbows. 3. Use your arms to help lift your chest up until you feel a gentle stretch in your abdomen and your lower back.  This will place some of your body weight on your elbows. If this is uncomfortable, try stacking pillows under your chest.  Your hips should stay down, against the surface that you are lying on. Keep your hip and back muscles relaxed. 4. Hold for __________ seconds. 5. Slowly relax your upper body and return to the starting position. Repeat __________ times.  Complete this exercise __________ times a day. Strengthening exercises These exercises build strength and endurance in your back. Endurance is the ability to use your muscles for a long time, even after they get tired. Exercise C: Pelvic tilt  1. Lie on your back on a firm surface. Bend your knees and keep your feet flat. 2. Tense your abdominal muscles. Tip your pelvis up toward the ceiling and flatten your lower back into the floor.  To help with this exercise, you may place a small towel under your lower back and try to push your back into  the towel. 3. Hold for __________ seconds. 4. Let your muscles relax completely before you repeat this exercise. Repeat __________ times. Complete this exercise __________ times a day. Exercise D: Alternating arm and leg raises   1. Get on your hands and knees on a firm surface. If you are on a hard floor, you may want to use padding to cushion your knees, such as an exercise mat. 2. Line up your arms and legs. Your hands should be below your shoulders, and your knees should be below your hips. 3. Lift your left leg behind you. At the same time, raise your right arm and straighten it in front of you.  Do not lift your leg higher than your hip.  Do not lift your arm higher than your shoulder.  Keep your abdominal and back muscles tight.  Keep your hips facing the ground.  Do not arch your back.  Keep your balance carefully, and do not hold your breath. 4. Hold for __________ seconds. 5. Slowly return to the starting position and repeat with your right leg and your left arm. Repeat __________ times. Complete this exercise __________ times a day. Exercise E: Abdominal set with straight leg raise   1. Lie on your back on a firm surface. 2. Bend one of your knees and keep your other leg straight. 3. Tense your abdominal muscles and lift your straight leg up, 4-6 inches (10-15 cm) off the ground. 4. Keep your abdominal muscles tight and hold for __________ seconds.  Do not hold your breath.  Do not arch your back. Keep it flat against the ground. 5. Keep your abdominal muscles tense as you slowly lower your leg back to the starting position. 6. Repeat with your other leg. Repeat __________ times. Complete this exercise __________ times a day. Posture and body mechanics   Body mechanics refers to the movements and positions of your body while you do your daily activities. Posture is part of body mechanics. Good posture and healthy body mechanics can help to relieve stress in your  body's tissues and joints. Good posture means that your spine is in its natural S-curve position (your spine is neutral), your shoulders are pulled back slightly, and your head is not tipped forward. The following are general guidelines for applying improved posture and body mechanics to your everyday activities. Standing    When standing, keep your spine neutral and your feet about hip-width apart. Keep a slight bend in your knees. Your ears, shoulders, and hips should line up.  When you do a task in which you stand in one place for a long time, place one foot up on a stable object that is 2-4 inches (5-10 cm) high, such as a footstool. This helps keep your spine neutral. Sitting    When sitting, keep your spine neutral and keep your feet flat on the floor. Use a footrest, if necessary, and keep  your thighs parallel to the floor. Avoid rounding your shoulders, and avoid tilting your head forward.  When working at a desk or a computer, keep your desk at a height where your hands are slightly lower than your elbows. Slide your chair under your desk so you are close enough to maintain good posture.  When working at a computer, place your monitor at a height where you are looking straight ahead and you do not have to tilt your head forward or downward to look at the screen. Resting    When lying down and resting, avoid positions that are most painful for you.  If you have pain with activities such as sitting, bending, stooping, or squatting (flexion-based activities), lie in a position in which your body does not bend very much. For example, avoid curling up on your side with your arms and knees near your chest (fetal position).  If you have pain with activities such as standing for a long time or reaching with your arms (extension-based activities), lie with your spine in a neutral position and bend your knees slightly. Try the following positions:  Lying on your side with a pillow between your  knees.  Lying on your back with a pillow under your knees. Lifting    When lifting objects, keep your feet at least shoulder-width apart and tighten your abdominal muscles.  Bend your knees and hips and keep your spine neutral. It is important to lift using the strength of your legs, not your back. Do not lock your knees straight out.  Always ask for help to lift heavy or awkward objects. This information is not intended to replace advice given to you by your health care provider. Make sure you discuss any questions you have with your health care provider. Document Released: 09/07/2005 Document Revised: 05/14/2016 Document Reviewed: 06/19/2015 Elsevier Interactive Patient Education  2017 [employer].

## 2016-12-08 NOTE — Assessment & Plan Note (Signed)
Obtain Chlamydia/gonorrhea,/trichomonas/NAA and HIV antibody. Discussed importance of protection from sexual transmitted diseases. No current symptoms. Additional treatment and follow-up pending blood work results if indicated.

## 2016-12-09 LAB — CHLAMYDIA/GONOCOCCUS/TRICHOMONAS, NAA
Chlamydia by NAA: NEGATIVE
Gonococcus by NAA: NEGATIVE
TRICH VAG BY NAA: NEGATIVE

## 2017-02-16 IMAGING — DX DG LUMBAR SPINE COMPLETE 4+V
5 series · 5 of 5 positions shown · non-contrast
Comparison: None.

CLINICAL DATA: Low back pain with radiation down both lower
extremities 2 days.

EXAM:
LUMBAR SPINE - COMPLETE 4+ VIEW

[l-spine ap]
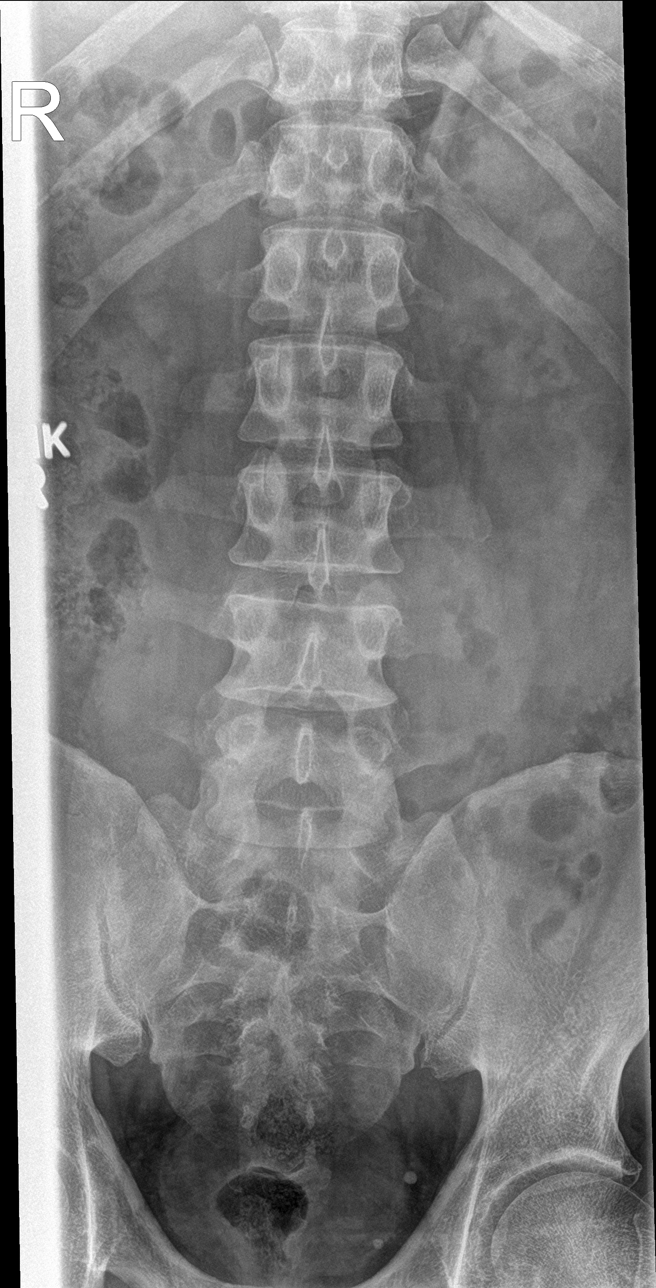

[l-spine obl (1 of 2)]
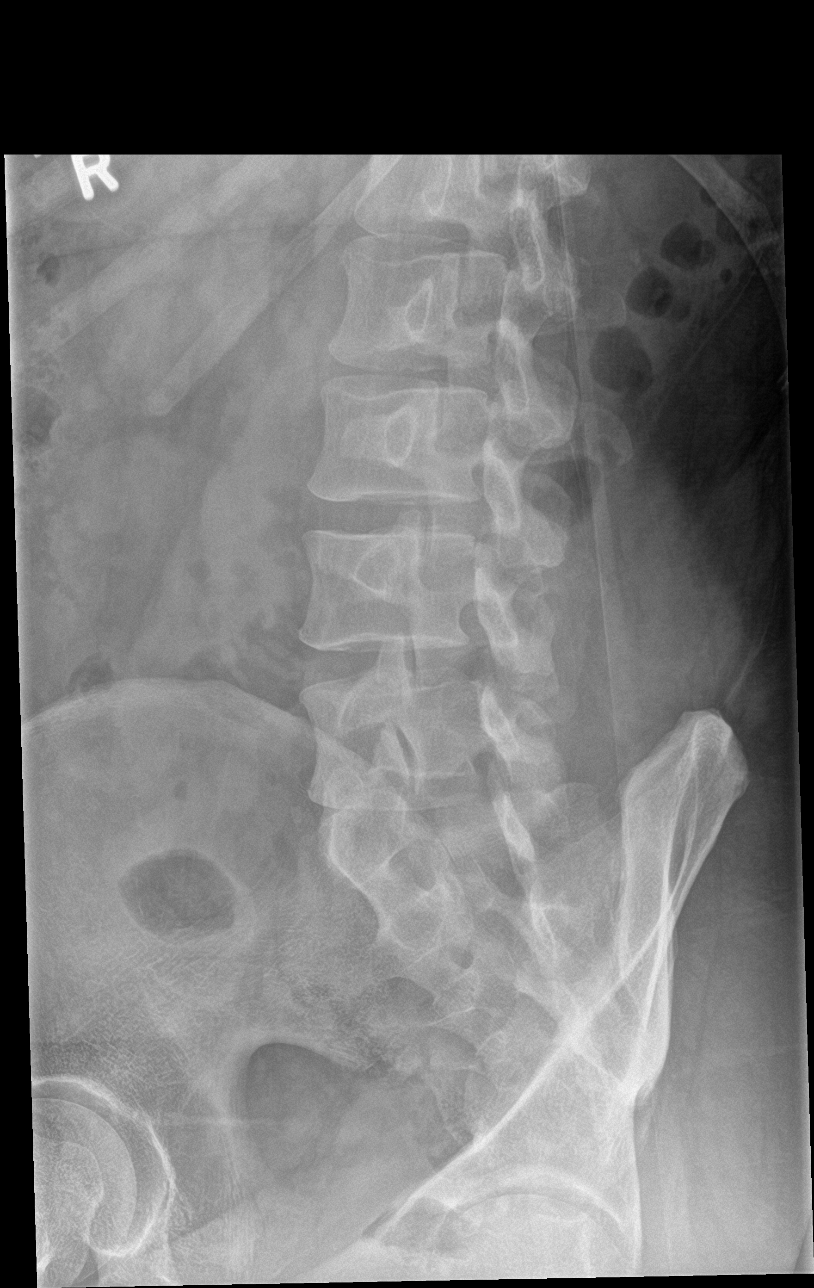

[l-spine obl (2 of 2)]
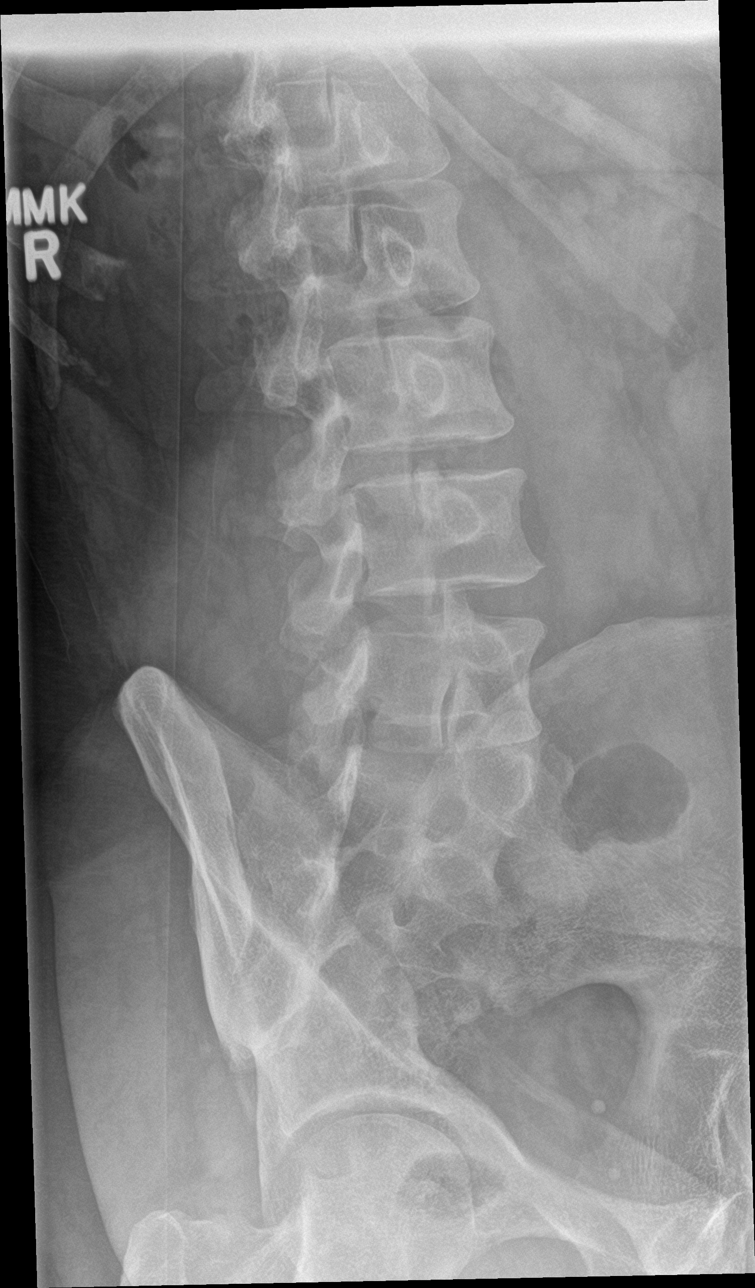

[l-spine lat]
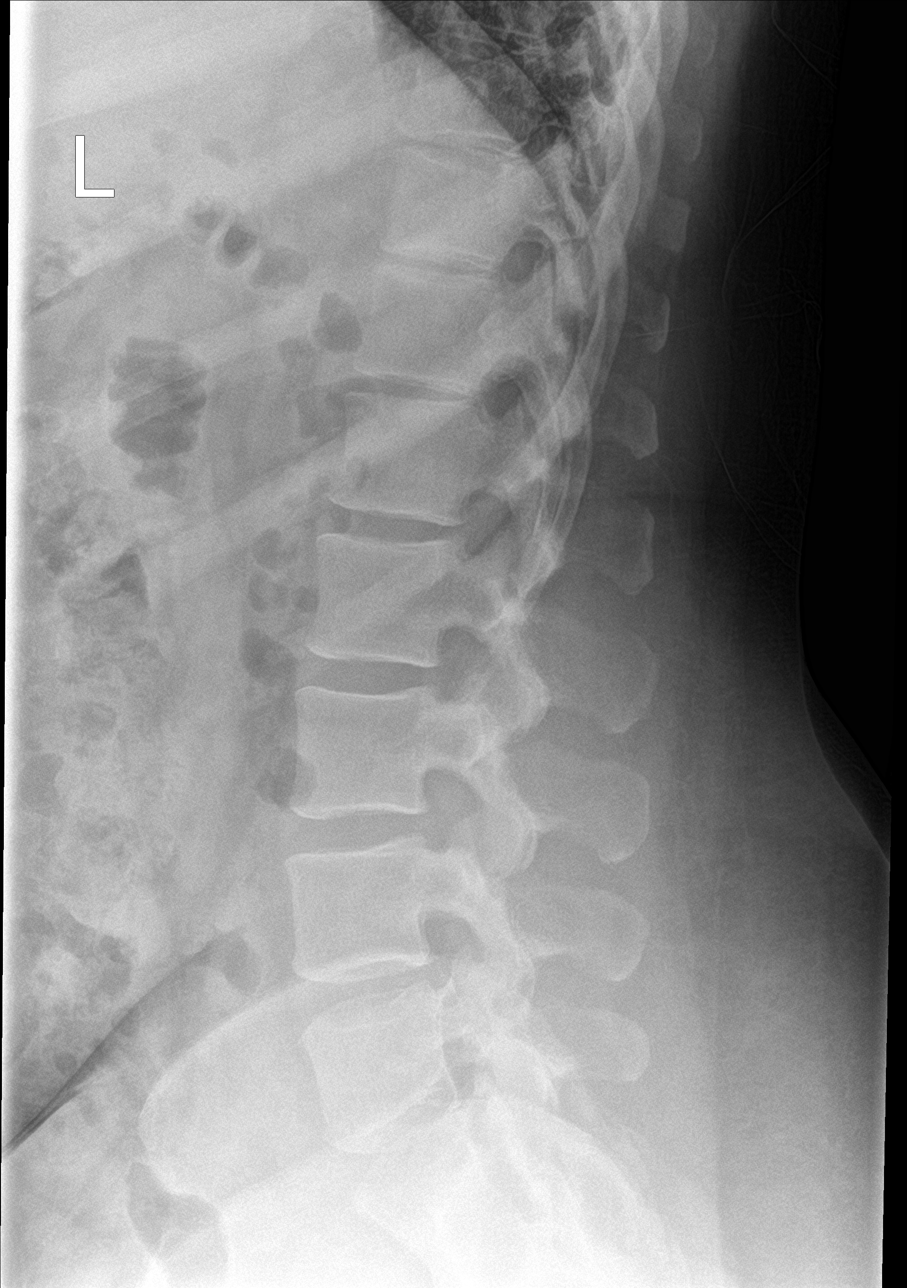

[l-spine spot]
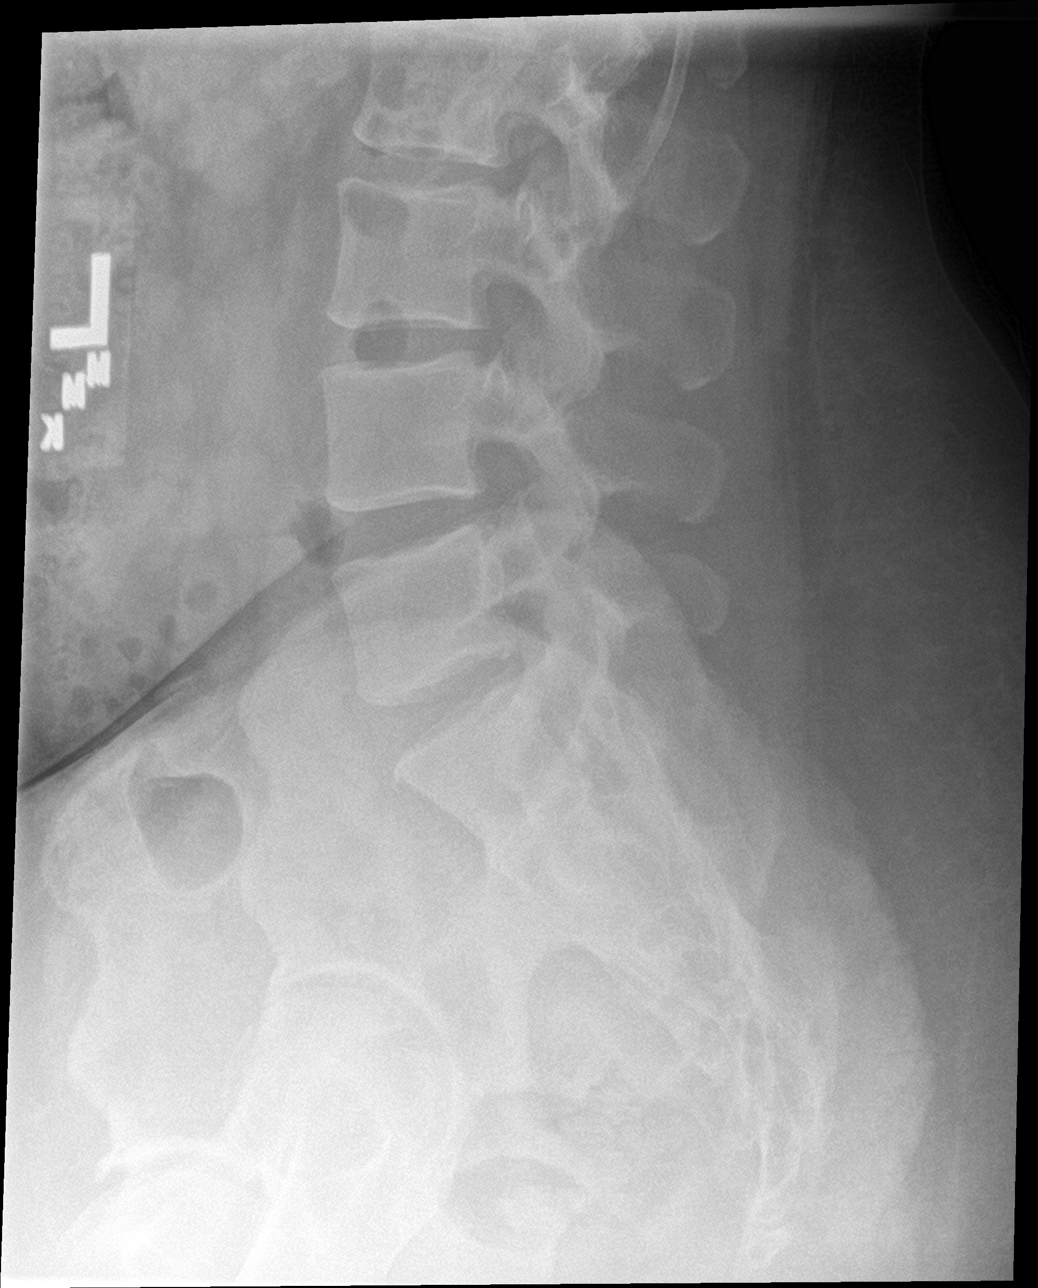

[5 of 5 positions shown; findings below may reference images not displayed]

FINDINGS: There is no evidence of lumbar spine fracture. Alignment is normal.
Intervertebral disc spaces are maintained.
IMPRESSION: Negative.

## 2017-03-08 ENCOUNTER — Telehealth: Payer: Self-pay | Admitting: *Deleted

## 2017-03-08 NOTE — Telephone Encounter (Signed)
Left msg on triage stating saw Marya Amsler month or so ago. He gave medication for infection is his foot completed medication, but don't think infection has cleared up wanting to get refill, or something else called into pharmacy...Johny Chess

## 2017-03-10 ENCOUNTER — Telehealth: Payer: Self-pay | Admitting: Family

## 2017-03-10 NOTE — Telephone Encounter (Signed)
Please advise 

## 2017-03-10 NOTE — Telephone Encounter (Signed)
[facility]     Patient Name: Trevor Ramos Initial Comment Caller states has a infection in his foot, wants more medication called in. Athletic foot very extreme.  DOB: 07/26/1971      Nurse Assessment  Nurse: Sherrell Puller, RN, Amy Date/Time Eilene Ghazi Time): 03/10/2017 12:30:52 PM  Confirm and document reason for call. If symptomatic, describe symptoms. ---Caller states he has an infection in his foot and would like more Prednisone called in. Says it did get better but still having some symptoms. This nurse did offer triage but the patient declined. Nurse advised that the office is in a meeting until 1 so he may call after that time. Patient verbalized understanding.  Does the patient have any new or worsening symptoms? ---Yes  Will a triage be completed? ---No  Select reason for no triage. ---Patient declined    Guidelines     Guideline Title Affirmed Question Affirmed Notes        Final Disposition User   Clinical Call Sherrell Puller, RN, Amy

## 2017-03-10 NOTE — Telephone Encounter (Signed)
Pt called in and would like to know if Marya Amsler would up the dosage on the med he gave him for athletes foot ?  He said that it is not getting better

## 2017-03-11 MED ORDER — TERBINAFINE HCL 250 MG PO TABS: 250.0000 mg | ORAL_TABLET | Freq: Two times a day (BID) | ORAL | 0 refills | Status: DC

## 2017-03-11 NOTE — Telephone Encounter (Signed)
Pt.notified

## 2017-03-11 NOTE — Telephone Encounter (Signed)
Medication has been sent to the pharmacy. 

## 2017-04-08 ENCOUNTER — Telehealth: Payer: Self-pay | Admitting: Family

## 2017-04-08 NOTE — Telephone Encounter (Signed)
Please advise if you are ok with refill. Not on pts med list.

## 2017-04-08 NOTE — Telephone Encounter (Signed)
Pt states he was prescribed Viagra awhile ago but did not have the money to get it at th pharmacy, he would like this called in now   [facility] [address]

## 2017-04-11 MED ORDER — SILDENAFIL CITRATE 100 MG PO TABS: 50.0000 mg | ORAL_TABLET | Freq: Every day | ORAL | 1 refills | Status: DC | PRN

## 2017-04-11 NOTE — Telephone Encounter (Signed)
Medication sent.

## 2018-02-09 ENCOUNTER — Other Ambulatory Visit: Payer: Self-pay

## 2018-02-09 ENCOUNTER — Encounter (HOSPITAL_COMMUNITY): Payer: Self-pay

## 2018-02-09 ENCOUNTER — Emergency Department (HOSPITAL_COMMUNITY)
Admission: EM | Admit: 2018-02-09 | Discharge: 2018-02-09 | Disposition: A | Discharge status: Home / Self Care | Payer: Commercial Managed Care - PPO | Attending: Emergency Medicine | Admitting: Emergency Medicine

## 2018-02-09 DIAGNOSIS — M549 Dorsalgia, unspecified: Secondary | ICD-10-CM | POA: Insufficient documentation

## 2018-02-09 DIAGNOSIS — R51 Headache: Secondary | ICD-10-CM | POA: Diagnosis not present

## 2018-02-09 DIAGNOSIS — R0789 Other chest pain: Secondary | ICD-10-CM | POA: Diagnosis not present

## 2018-02-09 DIAGNOSIS — Z5321 Procedure and treatment not carried out due to patient leaving prior to being seen by health care provider: Secondary | ICD-10-CM | POA: Insufficient documentation

## 2018-02-09 LAB — CBC
HCT: 42.1 % (ref 39.0–52.0)
Hemoglobin: 13.1 g/dL (ref 13.0–17.0)
MCH: 23 pg — ABNORMAL LOW (ref 26.0–34.0)
MCHC: 31.1 g/dL (ref 30.0–36.0)
MCV: 73.9 fL — ABNORMAL LOW (ref 78.0–100.0)
PLATELETS: 237 10*3/uL (ref 150–400)
RBC: 5.7 MIL/uL (ref 4.22–5.81)
RDW: 15.5 % (ref 11.5–15.5)
WBC: 8.4 10*3/uL (ref 4.0–10.5)

## 2018-02-09 LAB — BASIC METABOLIC PANEL
Anion gap: 7 (ref 5–15)
BUN: 8 mg/dL (ref 6–20)
CHLORIDE: 107 mmol/L (ref 101–111)
CO2: 23 mmol/L (ref 22–32)
Calcium: 9 mg/dL (ref 8.9–10.3)
Creatinine, Ser: 0.86 mg/dL (ref 0.61–1.24)
GFR calc Af Amer: >60 mL/min (ref >60)
GFR calc non Af Amer: >60 mL/min (ref >60)
Glucose, Bld: 85 mg/dL (ref 65–99)
POTASSIUM: 3.9 mmol/L (ref 3.5–5.1)
Sodium: 137 mmol/L (ref 135–145)

## 2018-02-09 LAB — I-STAT TROPONIN, ED: Troponin i, poc: 0 ng/mL (ref 0.00–0.08)

## 2018-02-09 NOTE — ED Notes (Signed)
Spoke with pt. About leaving AMA, tried to have him stay, but refused. I told pt. If things change or get worse, to check back in. "He acknowledged understanding and would monitor symptoms at home."

## 2018-02-09 NOTE — ED Triage Notes (Signed)
Pt arrived with c/o CP X2 days with back pain and headache.

## 2018-02-09 NOTE — ED Notes (Signed)
When placing pot back in waiting room and explaining to him that he would be going to X-Ray next, pt states that does not want an X ray. D/C standing orders.

## 2018-02-11 ENCOUNTER — Other Ambulatory Visit: Payer: Self-pay

## 2018-02-11 ENCOUNTER — Ambulatory Visit (INDEPENDENT_AMBULATORY_CARE_PROVIDER_SITE_OTHER): Payer: Commercial Managed Care - PPO | Admitting: Physician Assistant

## 2018-02-11 ENCOUNTER — Encounter: Payer: Self-pay | Admitting: Physician Assistant

## 2018-02-11 ENCOUNTER — Ambulatory Visit (INDEPENDENT_AMBULATORY_CARE_PROVIDER_SITE_OTHER): Payer: Commercial Managed Care - PPO

## 2018-02-11 VITALS — BP 124/94 | HR 72 | Temp 98.0°F | Ht 68.0 in | Wt 248.6 lb

## 2018-02-11 DIAGNOSIS — M7989 Other specified soft tissue disorders: Secondary | ICD-10-CM | POA: Diagnosis not present

## 2018-02-11 DIAGNOSIS — I517 Cardiomegaly: Secondary | ICD-10-CM

## 2018-02-11 MED ORDER — HYDROCHLOROTHIAZIDE 12.5 MG PO CAPS: 12.5000 mg | ORAL_CAPSULE | Freq: Every day | ORAL | 0 refills | Status: DC

## 2018-02-11 NOTE — Patient Instructions (Signed)
No NSAIDs for now.

## 2018-02-11 NOTE — Progress Notes (Signed)
02/15/2018 9:12 AM   DOB: 16-Dec-1970 / MRN: [id number]  SUBJECTIVE:  Trevor Ramos is a 47 y.o. male presenting for elevated BP.  This is a new problem.  Tells me he has been measuring pressures at 150-160/90-100.  He has been having HA at times of elevated blood pressure.  Also complains of bilateral leg swelling.  Denies orthopnea, SOB, DOE, CP. He denies any symptoms at this time. He did go to the ED and had a negative work up there.   He has No Known Allergies.   He  has a past medical history of Chronic back pain and GERD (gastroesophageal reflux disease).    He  reports that he has never smoked. He has never used smokeless tobacco. He reports that he drinks alcohol. He reports that he does not use drugs. He  has no sexual activity history on file. The patient  has no past surgical history on file.  His family history includes Healthy in his father and mother; Hypertension in his mother.  ROS Per HPI.   The problem list and medications were reviewed and updated by myself where necessary and exist elsewhere in the encounter.   OBJECTIVE:  BP (!) 124/94 (BP Location: Right Arm, Patient Position: Sitting, Cuff Size: Large)   Pulse 72   Temp 98 F (36.7 C) (Oral)   Ht 5\' 8"  (1.727 m)   Wt 248 lb 9.6 oz (112.8 kg)   SpO2 99%   BMI 37.80 kg/m   Physical Exam  Constitutional: He is oriented to person, place, and time. He appears well-developed. He is active.  Non-toxic appearance. He does not appear ill.  Eyes: Pupils are equal, round, and reactive to light. Conjunctivae and EOM are normal.  Cardiovascular: Normal rate, regular rhythm, S1 normal, S2 normal, normal heart sounds, intact distal pulses and normal pulses. Exam reveals no gallop and no friction rub.  No murmur heard. Pulmonary/Chest: Effort normal. No stridor. No respiratory distress. He has no wheezes. He has no rales.  Abdominal: He exhibits no distension.  Musculoskeletal: Normal range of motion. He exhibits  edema (bilateral lower extremities 1+).  Neurological: He is alert and oriented to person, place, and time. No cranial nerve deficit. Coordination normal.  Skin: Skin is warm and dry. He is not diaphoretic. No pallor.  Psychiatric: He has a normal mood and affect.  Nursing note and vitals reviewed.   No results found for this or any previous visit (from the past 72 hour(s)). BP Readings from Last 3 Encounters:  02/11/18 (!) 124/94  02/09/18 (!) 147/101  12/08/16 134/88    The 10-year ASCVD risk score Mikey Bussing DC Jr., et al., 2013) is: 4%   Values used to calculate the score:     Age: 14 years     Sex: Male     Is Non-Hispanic African American: Yes     Diabetic: No     Tobacco smoker: No     Systolic Blood Pressure: 629 mmHg     Is BP treated: No     HDL Cholesterol: 48 mg/dL     Total Cholesterol: 201 mg/dL   No results found.  ASSESSMENT AND PLAN:  Trevor Ramos was seen today for hypertension and joint swelling.  Diagnoses and all orders for this visit:  LVH (left ventricular hypertrophy): Greater than 10 mm in AVF along with a mild cardiomegaly on chest rads.  No SOB or orthopnea.  He has mild leg swelling.  He needs an echo  and cardiology consult.  Staring a low dose diuretic. Will hold on metop for now and differ to cards. He will come back next week for a quick recheck while cards is pending.  -     DG Chest 2 View; Future -     Iron, TIBC and Ferritin Panel -     Pathologist smear review -     hydrochlorothiazide (MICROZIDE) 12.5 MG capsule; Take 1 capsule (12.5 mg total) by mouth daily. -     TSH -     Ambulatory referral to Cardiology -     Brain natriuretic peptide -     CBC -     Hemoglobin A1c -     Lipid Panel    The patient is advised to call or return to clinic if he does not see an improvement in symptoms, or to seek the care of the closest emergency department if he worsens with the above plan.   Philis Fendt, MHS, PA-C [facility] 02/15/2018 9:12 AM

## 2018-02-12 LAB — CBC
HEMOGLOBIN: 13.8 g/dL (ref 13.0–17.7)
Hematocrit: 42.4 % (ref 37.5–51.0)
MCH: 24 pg — ABNORMAL LOW (ref 26.6–33.0)
MCHC: 32.5 g/dL (ref 31.5–35.7)
MCV: 74 fL — ABNORMAL LOW (ref 79–97)
Platelets: 251 10*3/uL (ref 150–450)
RBC: 5.75 x10E6/uL (ref 4.14–5.80)
RDW: 16.2 % — ABNORMAL HIGH (ref 12.3–15.4)
WBC: 8.1 10*3/uL (ref 3.4–10.8)

## 2018-02-12 LAB — IRON,TIBC AND FERRITIN PANEL
FERRITIN: 197 ng/mL (ref 30–400)
IRON SATURATION: 11 % — AB (ref 15–55)
Iron: 41 ug/dL (ref 38–169)
TIBC: 366 ug/dL (ref 250–450)
UIBC: 325 ug/dL (ref 111–343)

## 2018-02-12 LAB — BRAIN NATRIURETIC PEPTIDE: BNP: 8.3 pg/mL (ref 0.0–100.0)

## 2018-02-12 LAB — HEMOGLOBIN A1C
Est. average glucose Bld gHb Est-mCnc: 111 mg/dL
HEMOGLOBIN A1C: 5.5 % (ref 4.8–5.6)

## 2018-02-12 LAB — LIPID PANEL
Chol/HDL Ratio: 4.2 ratio (ref 0.0–5.0)
Cholesterol, Total: 201 mg/dL — ABNORMAL HIGH (ref 100–199)
HDL: 48 mg/dL (ref >39)
LDL Calculated: 142 mg/dL — ABNORMAL HIGH (ref 0–99)
TRIGLYCERIDES: 55 mg/dL (ref 0–149)
VLDL Cholesterol Cal: 11 mg/dL (ref 5–40)

## 2018-02-12 LAB — TSH: TSH: 2.51 u[IU]/mL (ref 0.450–4.500)

## 2018-02-18 ENCOUNTER — Ambulatory Visit: Payer: Commercial Managed Care - PPO | Admitting: Physician Assistant

## 2018-02-18 ENCOUNTER — Encounter: Payer: Self-pay | Admitting: Physician Assistant

## 2018-02-18 ENCOUNTER — Other Ambulatory Visit: Payer: Self-pay

## 2018-02-18 VITALS — BP 130/90 | HR 75 | Temp 98.4°F | Resp 18 | Ht 68.0 in | Wt 246.2 lb

## 2018-02-18 DIAGNOSIS — I517 Cardiomegaly: Secondary | ICD-10-CM

## 2018-02-18 DIAGNOSIS — I10 Essential (primary) hypertension: Secondary | ICD-10-CM | POA: Diagnosis not present

## 2018-02-18 DIAGNOSIS — G44209 Tension-type headache, unspecified, not intractable: Secondary | ICD-10-CM | POA: Diagnosis not present

## 2018-02-18 MED ORDER — CYCLOBENZAPRINE HCL 10 MG PO TABS: 5.0000 mg | ORAL_TABLET | Freq: Three times a day (TID) | ORAL | 0 refills | Status: DC | PRN

## 2018-02-18 MED ORDER — CHLORTHALIDONE 25 MG PO TABS: 12.5000 mg | ORAL_TABLET | Freq: Every day | ORAL | 1 refills | Status: DC

## 2018-02-18 NOTE — Patient Instructions (Addendum)
  You BP is worse on HCTZ.  Let's start a low dose of a better medication.    Please carve out four hours every day that you can get some sleep. I would like this to be about the same time every day.   Lets try the flexeril for the HA and continue to avoid NSAIDs.  Okay to take 1300 mg of tylenol every 8 hours for now for HA.    IF you received an x-ray today, you will receive an invoice from [facility]. Please contact [facility] at [phone] with questions or concerns regarding your invoice.   IF you received labwork today, you will receive an invoice from [place]. Please contact [facility] at [phone] with questions or concerns regarding your invoice.   Our billing staff will not be able to assist you with questions regarding bills from these companies.  You will be contacted with the lab results as soon as they are available. The fastest way to get your results is to activate your My Chart account. Instructions are located on the last page of this paperwork. If you have not heard from [place] regarding the results in 2 weeks, please contact this office.

## 2018-02-18 NOTE — Progress Notes (Signed)
02/19/2018 8:55 AM   DOB: [birth year] / MRN: [id number]  SUBJECTIVE:  Trevor Ramos is a 47 y.o. male presenting for recheck of elevated blood pressure and  med check.  Tells me he feels well today overall.  Continues to have a right-sided headache is been present for about a month now.  Denies vision changes, photophobia, neck pain, fever, gait disturbance, speech disturbance, weakness.  Patient does tell me that he sleeps 1 to 2 hours every day.  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   He has No Known Allergies.   He  has a past medical history of Chronic back pain and GERD (gastroesophageal reflux disease).    He  reports that he has never smoked. He has never used smokeless tobacco. He reports that he drinks alcohol. He reports that he does not use drugs. He  has no sexual activity history on file. The patient  has no past surgical history on file.  His family history includes Healthy in his father and mother; Hypertension in his mother.   Review of Systems  Constitutional: Negative for chills, diaphoresis and fever.  Respiratory: Negative for cough, hemoptysis, sputum production, shortness of breath and wheezing.   Cardiovascular: Negative for chest pain, orthopnea and leg swelling.  Gastrointestinal: Negative for abdominal pain, blood in stool, constipation, diarrhea, heartburn, melena, nausea and vomiting.  Genitourinary: Negative for dysuria, flank pain, frequency, hematuria and urgency.  Skin: Negative for rash.  Neurological: Negative for dizziness.    The problem list and medications were reviewed and updated by myself where necessary and exist elsewhere in the encounter.   OBJECTIVE:  BP 130/90 (BP Location: Right Arm, Patient Position: Sitting, Cuff Size: Large)   Pulse 75   Temp 98.4 F (36.9 C) (Oral)   Resp 18   Ht 5\' 8"  (1.727 m)   Wt 246 lb 3.2 oz (111.7 kg)   SpO2 98%   BMI 37.43 kg/m   Wt Readings from  Last 3 Encounters:  02/18/18 246 lb 3.2 oz (111.7 kg)  02/11/18 248 lb 9.6 oz (112.8 kg)  02/09/18 240 lb (108.9 kg)   Temp Readings from Last 3 Encounters:  02/18/18 98.4 F (36.9 C) (Oral)  02/11/18 98 F (36.7 C) (Oral)  02/09/18 98.5 F (36.9 C) (Oral)   BP Readings from Last 3 Encounters:  02/18/18 130/90  02/11/18 (!) 124/94  02/09/18 (!) 147/101   Pulse Readings from Last 3 Encounters:  02/18/18 75  02/11/18 72  02/09/18 80   Physical Exam  Constitutional: He is oriented to person, place, and time. He appears well-developed. He does not appear ill.  Eyes: Pupils are equal, round, and reactive to light. Conjunctivae and EOM are normal.  Cardiovascular: Normal rate, regular rhythm, S1 normal, S2 normal, normal heart sounds, intact distal pulses and normal pulses. Exam reveals no gallop and no friction rub.  No murmur heard. Pulmonary/Chest: Effort normal. No stridor. No respiratory distress. He has no wheezes. He has no rales.  Abdominal: He exhibits no distension.  Musculoskeletal: Normal range of motion. He exhibits no edema.  Neurological: He is alert and oriented to person, place, and time. He has normal strength and normal reflexes. He is not disoriented. He displays no atrophy, no tremor and normal reflexes. No cranial nerve deficit or sensory deficit. He exhibits normal muscle tone. He displays a negative Romberg sign. He displays no seizure activity. Coordination and gait normal. GCS  eye subscore is 4. GCS verbal subscore is 5. GCS motor subscore is 6.  Skin: Skin is warm and dry. He is not diaphoretic.  Psychiatric: He has a normal mood and affect. His behavior is normal.  Nursing note and vitals reviewed.   Lab Results  Component Value Date   CREATININE 0.86 02/09/2018   BUN 8 02/09/2018   NA 137 02/09/2018   K 4.3 02/18/2018   CL 107 02/09/2018   CO2 23 02/09/2018     Results for orders placed or performed in visit on 02/18/18 (from the past 72 hour(s))   Potassium     Status: None   Collection Time: 02/18/18  5:39 PM  Result Value Ref Range   Potassium 4.3 3.5 - 5.2 mmol/L    No results found.  ASSESSMENT AND PLAN:  Trevor Ramos was seen today for hypertension and follow-up.  Diagnoses and all orders for this visit:  LVH (left ventricular hypertrophy): Awaiting cardiology consult.  He will continue to avoid NSAIDs, given the last problem.  His blood pressure is actually worse today and he has gained weight since I started him on hydrochlorothiazide.  I am moving him to a more potent and longer acting diuretic at a medium dose.  I will see him back in a month.  Essential hypertension -     chlorthalidone (HYGROTON) 25 MG tablet; Take 0.5 tablets (12.5 mg total) by mouth daily. -     Potassium  Tension headache -     cyclobenzaprine (FLEXERIL) 10 MG tablet; Take 0.5-1 tablets (5-10 mg total) by mouth 3 (three) times daily as needed.    The patient is advised to call or return to clinic if he does not see an improvement in symptoms, or to seek the care of the closest emergency department if he worsens with the above plan.   Philis Fendt, MHS, PA-C [facility] 02/19/2018 8:55 AM

## 2018-02-19 LAB — POTASSIUM: Potassium: 4.3 mmol/L (ref 3.5–5.2)

## 2018-02-19 NOTE — Progress Notes (Signed)
Please make patient aware of results via letter. In the context of his overall presentation any abnormal values are of no clinical significance.  Philis Fendt PA-C, 02/19/2018 8:10 AM

## 2018-02-21 ENCOUNTER — Encounter: Payer: Self-pay | Admitting: *Deleted

## 2018-02-21 LAB — PATHOLOGIST SMEAR REVIEW
BASOS ABS: 0 10*3/uL (ref 0.0–0.2)
Basos: 0 %
EOS (ABSOLUTE): 0.1 10*3/uL (ref 0.0–0.4)
EOS: 1 %
Hematocrit: 42.9 % (ref 37.5–51.0)
Hemoglobin: 14.1 g/dL (ref 13.0–17.7)
IMMATURE GRANULOCYTES: 0 %
Immature Grans (Abs): 0 10*3/uL (ref 0.0–0.1)
Lymphocytes Absolute: 2.9 10*3/uL (ref 0.7–3.1)
Lymphs: 37 %
MCH: 23.7 pg — ABNORMAL LOW (ref 26.6–33.0)
MCHC: 32.9 g/dL (ref 31.5–35.7)
MCV: 72 fL — ABNORMAL LOW (ref 79–97)
Monocytes Absolute: 0.7 10*3/uL (ref 0.1–0.9)
Monocytes: 9 %
NEUTROS PCT: 53 %
Neutrophils Absolute: 4.2 10*3/uL (ref 1.4–7.0)
PATH REV PLTS: NORMAL
PLATELETS: 253 10*3/uL (ref 150–450)
Path Rev WBC: NORMAL
RBC: 5.95 x10E6/uL — ABNORMAL HIGH (ref 4.14–5.80)
RDW: 16.5 % — ABNORMAL HIGH (ref 12.3–15.4)
WBC: 7.9 10*3/uL (ref 3.4–10.8)

## 2018-02-26 ENCOUNTER — Emergency Department (HOSPITAL_COMMUNITY): Payer: Commercial Managed Care - PPO

## 2018-02-26 ENCOUNTER — Other Ambulatory Visit: Payer: Self-pay

## 2018-02-26 ENCOUNTER — Encounter (HOSPITAL_COMMUNITY): Payer: Self-pay | Admitting: Emergency Medicine

## 2018-02-26 ENCOUNTER — Emergency Department (HOSPITAL_COMMUNITY)
Admission: EM | Admit: 2018-02-26 | Discharge: 2018-02-26 | Disposition: A | Discharge status: Home / Self Care | Payer: Commercial Managed Care - PPO | Attending: Emergency Medicine | Admitting: Emergency Medicine

## 2018-02-26 DIAGNOSIS — R7989 Other specified abnormal findings of blood chemistry: Secondary | ICD-10-CM | POA: Diagnosis not present

## 2018-02-26 DIAGNOSIS — R079 Chest pain, unspecified: Secondary | ICD-10-CM | POA: Diagnosis present

## 2018-02-26 DIAGNOSIS — R0789 Other chest pain: Secondary | ICD-10-CM | POA: Insufficient documentation

## 2018-02-26 LAB — BASIC METABOLIC PANEL
Anion gap: 10 (ref 5–15)
BUN: 12 mg/dL (ref 6–20)
CALCIUM: 8.8 mg/dL — AB (ref 8.9–10.3)
CO2: 26 mmol/L (ref 22–32)
Chloride: 103 mmol/L (ref 101–111)
Creatinine, Ser: 0.89 mg/dL (ref 0.61–1.24)
GFR calc Af Amer: >60 mL/min (ref >60)
GLUCOSE: 97 mg/dL (ref 65–99)
POTASSIUM: 3.4 mmol/L — AB (ref 3.5–5.1)
SODIUM: 139 mmol/L (ref 135–145)

## 2018-02-26 LAB — CBC
HEMATOCRIT: 41 % (ref 39.0–52.0)
Hemoglobin: 13.3 g/dL (ref 13.0–17.0)
MCH: 24.2 pg — ABNORMAL LOW (ref 26.0–34.0)
MCHC: 32.4 g/dL (ref 30.0–36.0)
MCV: 74.5 fL — ABNORMAL LOW (ref 78.0–100.0)
PLATELETS: 266 10*3/uL (ref 150–400)
RBC: 5.5 MIL/uL (ref 4.22–5.81)
RDW: 14.9 % (ref 11.5–15.5)
WBC: 5.5 10*3/uL (ref 4.0–10.5)

## 2018-02-26 LAB — I-STAT TROPONIN, ED: Troponin i, poc: 0 ng/mL (ref 0.00–0.08)

## 2018-02-26 LAB — D-DIMER, QUANTITATIVE: D-Dimer, Quant: 0.84 ug/mL-FEU — ABNORMAL HIGH (ref 0.00–0.50)

## 2018-02-26 MED ORDER — MORPHINE SULFATE (PF) 4 MG/ML IV SOLN
4.0000 mg | Freq: Once | INTRAVENOUS | Status: AC
  Administered 2018-02-26: 4 mg via INTRAVENOUS
  Filled 2018-02-26: qty 1

## 2018-02-26 MED ORDER — IOPAMIDOL (ISOVUE-370) INJECTION 76%
INTRAVENOUS | Status: AC
  Filled 2018-02-26: qty 100

## 2018-02-26 MED ORDER — TECHNETIUM TO 99M ALBUMIN AGGREGATED
4.3000 | Freq: Once | INTRAVENOUS | Status: AC
  Administered 2018-02-26: 4.3 via INTRAVENOUS

## 2018-02-26 MED ORDER — IOPAMIDOL (ISOVUE-370) INJECTION 76%
100.0000 mL | Freq: Once | INTRAVENOUS | Status: AC | PRN
  Administered 2018-02-26: 100 mL via INTRAVENOUS

## 2018-02-26 MED ORDER — KETOROLAC TROMETHAMINE 30 MG/ML IJ SOLN
30.0000 mg | Freq: Once | INTRAMUSCULAR | Status: AC
  Administered 2018-02-26: 30 mg via INTRAVENOUS
  Filled 2018-02-26: qty 1

## 2018-02-26 MED ORDER — TECHNETIUM TC 99M DIETHYLENETRIAME-PENTAACETIC ACID
33.0000 | Freq: Once | INTRAVENOUS | Status: AC
  Administered 2018-02-26: 33 via RESPIRATORY_TRACT

## 2018-02-26 NOTE — Discharge Instructions (Addendum)
We saw you in the ER for the chest pain/shortness of breath. All of our cardiac workup is normal, including labs, EKG and chest X-RAY are normal. We are not sure what is causing your discomfort, but we feel comfortable sending you home at this time. The workup in the ER is not complete, and you should follow up with your primary care doctor for further evaluation.  

## 2018-02-26 NOTE — ED Triage Notes (Signed)
Patient complaining of right side chest pain. Patient states that he was put on some medication and did not get it filled. Patient states two days ago he started feeling chest pain and it got worse this morning.

## 2018-02-26 NOTE — ED Notes (Signed)
Patient transported to CT, unable to obtain vitals at this time.

## 2018-02-26 NOTE — ED Provider Notes (Signed)
[place] DEPT Provider Note   CSN: [id number] Arrival date & time: 02/26/18  0347     History   Chief Complaint Chief Complaint  Patient presents with  . Chest Pain    HPI Trevor Ramos is a 47 y.o. male.  HPI Pt is a 47 yo male who presents to the ER with complaints of sharp right sided CP x 2 days. Pain is constant. Some SOB. No unilateral leg swelling.  No hx of DVT or PE. Reports mild upper back pain as well. Pain is moderate in severity and worse with deep breathing, palpation of the right chest and bending over and certain positions. Nothing improves his pain   Past Medical History:  Diagnosis Date  . Chronic back pain   . GERD (gastroesophageal reflux disease)     Patient Active Problem List   Diagnosis Date Noted  . Acute bilateral low back pain with bilateral sciatica 12/08/2016  . Exposure to sexually transmitted disease (STD) 05/05/2016  . Lipoma of shoulder 01/15/2016  . Erectile dysfunction 10/09/2015    History reviewed. No pertinent surgical history.      Home Medications    Prior to Admission medications   Medication Sig Start Date End Date Taking? Authorizing Provider  chlorthalidone (HYGROTON) 25 MG tablet Take 0.5 tablets (12.5 mg total) by mouth daily. 02/18/18   Tereasa Coop, PA-C  cyclobenzaprine (FLEXERIL) 10 MG tablet Take 0.5-1 tablets (5-10 mg total) by mouth 3 (three) times daily as needed. 02/18/18   Tereasa Coop, PA-C    Family History Family History  Problem Relation Age of Onset  . Healthy Mother   . Hypertension Mother   . Healthy Father     Social History Social History   Tobacco Use  . Smoking status: Never Smoker  . Smokeless tobacco: Never Used  Substance Use Topics  . Alcohol use: Yes    Comment: occ  . Drug use: No     Allergies   Patient has no known allergies.   Review of Systems Review of Systems  All other systems reviewed and are negative.    Physical  Exam Updated Vital Signs BP (!) 135/93 (BP Location: Right Arm)   Pulse 77   Temp 97.8 F (36.6 C) (Oral)   Resp 18   Ht 5\' 8"  (1.727 m)   Wt 108.9 kg (240 lb)   SpO2 99%   BMI 36.49 kg/m   Physical Exam  Constitutional: He is oriented to person, place, and time. He appears well-developed and well-nourished.  HENT:  Head: Normocephalic and atraumatic.  Eyes: EOM are normal.  Neck: Normal range of motion.  Cardiovascular: Normal rate, regular rhythm, normal heart sounds and intact distal pulses.  Pulmonary/Chest: Effort normal and breath sounds normal. No respiratory distress.  Tenderness right lateral chest wall without rash  Abdominal: Soft. He exhibits no distension. There is no tenderness.  Musculoskeletal: Normal range of motion.  Neurological: He is alert and oriented to person, place, and time.  Skin: Skin is warm and dry.  Psychiatric: He has a normal mood and affect. Judgment normal.  Nursing note and vitals reviewed.    ED Treatments / Results  Labs (all labs ordered are listed, but only abnormal results are displayed) Labs Reviewed  BASIC METABOLIC PANEL - Abnormal; Notable for the following components:      Result Value   Potassium 3.4 (*)    Calcium 8.8 (*)    All other components within  normal limits  CBC - Abnormal; Notable for the following components:   MCV 74.5 (*)    MCH 24.2 (*)    All other components within normal limits  D-DIMER, QUANTITATIVE (NOT AT [facility]) - Abnormal; Notable for the following components:   D-Dimer, Quant 0.84 (*)    All other components within normal limits  I-STAT TROPONIN, ED    EKG EKG Interpretation  Date/Time:  Saturday February 26 2018 06:51:18 EDT Ventricular Rate:  71 PR Interval:    QRS Duration: 100 QT Interval:  397 QTC Calculation: 432 R Axis:   32 Text Interpretation:  Sinus rhythm RSR' in V1 or V2, right VCD or RVH Probable left ventricular hypertrophy No significant change was found Confirmed by Jola Schmidt [phone]) on 02/26/2018 7:12:53 AM Also confirmed by Jola Schmidt [id number]), editor [place], Jeannetta Nap [phone])  on 02/26/2018 10:47:21 AM   Radiology Dg Chest 2 View  Result Date: 02/26/2018 CLINICAL DATA:  Chest pain EXAM: CHEST - 2 VIEW COMPARISON:  Feb 11, 2018 FINDINGS: There is no edema or consolidation. The heart size and pulmonary vascularity are normal. No adenopathy. No pneumothorax. No bone lesions. IMPRESSION: No edema or consolidation. Electronically Signed   By: Lowella Grip III M.D.   On: 02/26/2018 07:24    Procedures Procedures (including critical care time)  Medications Ordered in ED Medications  iopamidol (ISOVUE-370) 76 % injection (has no administration in time range)  ketorolac (TORADOL) 30 MG/ML injection 30 mg (30 mg Intravenous Given 02/26/18 0832)  morphine 4 MG/ML injection 4 mg (4 mg Intravenous Given 02/26/18 0832)  iopamidol (ISOVUE-370) 76 % injection 100 mL (100 mLs Intravenous Contrast Given 02/26/18 0850)     Initial Impression / Assessment and Plan / ED Course  I have reviewed the triage vital signs and the nursing notes.  Pertinent labs & imaging results that were available during my care of the patient were reviewed by me and considered in my medical decision making (see chart for details).     Tenderness of right lateral chest wall. Elevated dimer. CTA pending to evaluate for PE. Constant pain, thus low suspicion for ACS as ecg without ischemic changes and initial troponin negative.   Despite multiple attempts by multiple nurses and myself we are unable to gain IV access to warrant CT Angie of the chest.  Patient will need a VQ scan to evaluate for possible pulmonary embolus.  VQ scan pending at this time.  Charge nurse was finally able to get at 24-gauge IV in his finger.  Patient and family been updated as to the delays unfortunately the majority of the delay was secondary to difficult IV access.  Care to Dr Kathrynn Humble to follow-up on VQ  scan.  Final Clinical Impressions(s) / ED Diagnoses   Final diagnoses:  None    ED Discharge Orders    None       Jola Schmidt, MD 02/26/18 [phone]

## 2018-02-26 NOTE — ED Notes (Signed)
Patient transported to CT 

## 2018-02-26 NOTE — ED Notes (Signed)
Patients iv infiltrated x2 for CTA of chest. Unable to complete exam at this time. Rn aware

## 2018-02-26 NOTE — ED Provider Notes (Addendum)
  Physical Exam  BP 117/78 (BP Location: Left Arm)   Pulse 67   Temp 97.8 F (36.6 C) (Oral)   Resp 18   Ht 5\' 8"  (1.727 m)   Wt 108.9 kg (240 lb)   SpO2 100%   BMI 36.49 kg/m   Physical Exam  ED Course/Procedures     Procedures  MDM   Assuming care of patient from Dr. Venora Maples.   Patient in the ED for for chest pain. Workup thus far shows neg troponin and elevated dimer.  Concerning findings are as following elevated dimer. Important pending results are VQ scan - which was ordered as the team was unable to secure a large bore iv access for CT PE and pt has low probability for PE based on clinical evaluation.  According to Dr. Venora Maples, plan is to f/u on the VQ scan.   Patient had no complains, no concerns from the nursing side. Will continue to monitor.    Varney Biles, MD 02/26/18 1617   6:41 PM Results from the ER workup discussed with the patient face to face and all questions answered to the best of my ability.    Varney Biles, MD 02/26/18 [phone]

## 2018-09-23 ENCOUNTER — Other Ambulatory Visit: Payer: Self-pay

## 2018-09-23 ENCOUNTER — Encounter: Payer: Commercial Managed Care - PPO | Admitting: Family Medicine

## 2018-10-15 ENCOUNTER — Emergency Department (HOSPITAL_COMMUNITY)
Admission: EM | Admit: 2018-10-15 | Discharge: 2018-10-15 | Disposition: A | Discharge status: Home / Self Care | Payer: Commercial Managed Care - PPO | Attending: Emergency Medicine | Admitting: Emergency Medicine

## 2018-10-15 ENCOUNTER — Other Ambulatory Visit: Payer: Self-pay

## 2018-10-15 ENCOUNTER — Encounter (HOSPITAL_COMMUNITY): Payer: Self-pay | Admitting: Emergency Medicine

## 2018-10-15 DIAGNOSIS — Y929 Unspecified place or not applicable: Secondary | ICD-10-CM | POA: Diagnosis not present

## 2018-10-15 DIAGNOSIS — Y999 Unspecified external cause status: Secondary | ICD-10-CM | POA: Diagnosis not present

## 2018-10-15 DIAGNOSIS — S61211A Laceration without foreign body of left index finger without damage to nail, initial encounter: Secondary | ICD-10-CM | POA: Insufficient documentation

## 2018-10-15 DIAGNOSIS — Y939 Activity, unspecified: Secondary | ICD-10-CM | POA: Diagnosis not present

## 2018-10-15 DIAGNOSIS — X58XXXA Exposure to other specified factors, initial encounter: Secondary | ICD-10-CM | POA: Diagnosis not present

## 2018-10-15 NOTE — ED Provider Notes (Signed)
[place] EMERGENCY DEPARTMENT Provider Note   CSN: [id number] Arrival date & time: 10/15/18  1417     History   Chief Complaint Chief Complaint  Patient presents with  . Finger Injury    HPI Trevor Ramos is a 48 y.o. male.  HPI  48 year old male cut his left index finger while cooking just prior to coming to the ED.  He states that he cleaned it out at home with running water.  He came in because it continued to bleed.  He denies being on any blood thinners.  He states he has recently had a tetanus shot.  He denies any numbness or tingling in the finger.  Past Medical History:  Diagnosis Date  . Chronic back pain   . GERD (gastroesophageal reflux disease)     Patient Active Problem List   Diagnosis Date Noted  . Acute bilateral low back pain with bilateral sciatica 12/08/2016  . Exposure to sexually transmitted disease (STD) 05/05/2016  . Lipoma of shoulder 01/15/2016  . Erectile dysfunction 10/09/2015    History reviewed. No pertinent surgical history.      Home Medications    Prior to Admission medications   Medication Sig Start Date End Date Taking? Authorizing Provider  chlorthalidone (HYGROTON) 25 MG tablet Take 0.5 tablets (12.5 mg total) by mouth daily. 02/18/18   Tereasa Coop, PA-C  cyclobenzaprine (FLEXERIL) 10 MG tablet Take 0.5-1 tablets (5-10 mg total) by mouth 3 (three) times daily as needed. 02/18/18   Tereasa Coop, PA-C    Family History Family History  Problem Relation Age of Onset  . Healthy Mother   . Hypertension Mother   . Healthy Father     Social History Social History   Tobacco Use  . Smoking status: Never Smoker  . Smokeless tobacco: Never Used  Substance Use Topics  . Alcohol use: Yes    Comment: occ  . Drug use: No     Allergies   Patient has no known allergies.   Review of Systems Review of Systems  All other systems reviewed and are negative.    Physical Exam Updated Vital  Signs BP 127/82 (BP Location: Right Arm)   Pulse 96   Temp 98.2 F (36.8 C) (Oral)   Resp 18   SpO2 100%   Physical Exam Vitals signs and nursing note reviewed.  Constitutional:      Appearance: He is well-developed.  HENT:     Head: Normocephalic and atraumatic.     Right Ear: External ear normal.     Left Ear: External ear normal.     Nose: Nose normal.  Neck:     Trachea: No tracheal deviation.  Pulmonary:     Effort: Pulmonary effort is normal.  Musculoskeletal: Normal range of motion.       Hands:     Comments: 1.5 cm laceration left fingertip into subcutaneous fat Patient with two-point sensation intact Flexion extension at PIP and DIP intact  Skin:    General: Skin is warm and dry.  Neurological:     Mental Status: He is alert and oriented to person, place, and time.  Psychiatric:        Behavior: Behavior normal.      ED Treatments / Results  Labs (all labs ordered are listed, but only abnormal results are displayed) Labs Reviewed - No data to display  EKG None  Radiology No results found.  Procedures Procedures (including critical care time)  Medications  Ordered in ED Medications - No data to display   Initial Impression / Assessment and Plan / ED Course  I have reviewed the triage vital signs and the nursing notes.  Pertinent labs & imaging results that were available during my care of the patient were reviewed by me and considered in my medical decision making (see chart for details).     With left finger laceration that is clean and does not appear to need sutures.  They will be cleaned here in the ED and Steri-Strips placed.  Final Clinical Impressions(s) / ED Diagnoses   Final diagnoses:  Laceration of left index finger without foreign body without damage to nail, initial encounter    ED Discharge Orders    None       Pattricia Boss, MD 10/15/18 1444

## 2018-10-15 NOTE — Discharge Instructions (Addendum)
Please keep dressing in place with no exposure to water for 24 hours.  You may then rinse off in the shower.  Please examine wound daily for signs of infection such as increased pain, swelling, or redness.  Steri-Strips should come off during showering process through next week to 10 days.

## 2018-10-15 NOTE — ED Notes (Signed)
Patient verbalizes understanding of discharge instructions. Opportunity for questioning and answers were provided. Armband removed by staff, pt discharged from ED.  

## 2018-10-15 NOTE — ED Triage Notes (Signed)
Pt. Stated, I was cooking and Ii think I sliced the side of my finger. Left

## 2019-04-15 ENCOUNTER — Other Ambulatory Visit: Payer: Self-pay | Admitting: Internal Medicine

## 2019-04-15 DIAGNOSIS — Z20822 Contact with and (suspected) exposure to covid-19: Secondary | ICD-10-CM

## 2019-04-17 LAB — NOVEL CORONAVIRUS, NAA: SARS-CoV-2, NAA: NOT DETECTED

## 2020-03-21 ENCOUNTER — Other Ambulatory Visit: Payer: Self-pay

## 2020-03-21 ENCOUNTER — Encounter (HOSPITAL_BASED_OUTPATIENT_CLINIC_OR_DEPARTMENT_OTHER): Payer: Self-pay | Admitting: Surgery

## 2020-03-26 ENCOUNTER — Other Ambulatory Visit (HOSPITAL_COMMUNITY)
Admission: RE | Admit: 2020-03-26 | Discharge: 2020-03-26 | Disposition: A | Discharge status: Home / Self Care | Payer: Commercial Managed Care - PPO | Source: Ambulatory Visit | Attending: Surgery | Admitting: Surgery

## 2020-03-26 DIAGNOSIS — Z01812 Encounter for preprocedural laboratory examination: Secondary | ICD-10-CM | POA: Diagnosis present

## 2020-03-26 DIAGNOSIS — Z20822 Contact with and (suspected) exposure to covid-19: Secondary | ICD-10-CM | POA: Insufficient documentation

## 2020-03-26 LAB — SARS CORONAVIRUS 2 (TAT 6-24 HRS): SARS Coronavirus 2: NEGATIVE

## 2020-03-28 NOTE — H&P (Signed)
Chief Complaint:  Large mass posterior right arm.  History of Present Illness:  Trevor Ramos is an 49 y.o. male who presents for removal of fatty mass of the right arm.  It has been getting pinched when he turns. Will remove under general as an outpatient. It is 7 cm in diameter.     Past Medical History:  Diagnosis Date  . Chronic back pain   . GERD (gastroesophageal reflux disease)     History reviewed. No pertinent surgical history.  No current facility-administered medications for this encounter.   No current outpatient medications on file.   Patient has no known allergies. Family History  Problem Relation Age of Onset  . Healthy Mother   . Hypertension Mother   . Healthy Father    Social History:   reports that he has been smoking cigars. He has never used smokeless tobacco. He reports current alcohol use. He reports that he does not use drugs.   REVIEW OF SYSTEMS : Negative except for see problem list  Physical Exam:   Height 5\' 9"  (1.753 m), weight 108 kg. Body mass index is 35.15 kg/m.  Gen:  WDWN AAM NAD  Neurological: Alert and oriented to person, place, and time. Motor and sensory function is grossly intact  Head: Normocephalic and atraumatic.  Eyes: Conjunctivae are normal. Pupils are equal, round, and reactive to light. No scleral icterus.  Neck: Normal range of motion. Neck supple. No tracheal deviation or thyromegaly present.  Cardiovascular:  SR without murmurs or gallops.  No carotid bruits Breast:  Not examined Respiratory: Effort normal.  No respiratory distress. No chest wall tenderness. Breath sounds normal.  No wheezes, rales or rhonchi.  Abdomen:  nontender GU:  Not examined Musculoskeletal: Normal range of motion. Extremities are nontender.  7 cm mass on the right posterior arm.   Lymphadenopathy: No cervical, preauricular, postauricular or axillary adenopathy is present Skin: Skin is warm and dry. No rash noted. No diaphoresis. No  erythema. No pallor. Pscyh: Normal mood and affect. Behavior is normal. Judgment and thought content normal.   LABORATORY RESULTS: No results found for this or any previous visit (from the past 48 hour(s)).   RADIOLOGY RESULTS: No results found.  Problem List: Patient Active Problem List   Diagnosis Date Noted  . Acute bilateral low back pain with bilateral sciatica 12/08/2016  . Exposure to sexually transmitted disease (STD) 05/05/2016  . Lipoma of shoulder 01/15/2016  . Erectile dysfunction 10/09/2015    Assessment & Plan: Lipoma of right arm/shoulder for excision    Matt B. Hassell Done, MD, [facility] [phone] beeper [phone]  03/28/2020 12:21 PM

## 2020-03-29 ENCOUNTER — Ambulatory Visit (HOSPITAL_BASED_OUTPATIENT_CLINIC_OR_DEPARTMENT_OTHER)
Admission: RE | Admit: 2020-03-29 | Discharge: 2020-03-29 | Disposition: A | Discharge status: Home / Self Care | Payer: Commercial Managed Care - PPO | Attending: Surgery | Admitting: Surgery

## 2020-03-29 ENCOUNTER — Encounter (HOSPITAL_BASED_OUTPATIENT_CLINIC_OR_DEPARTMENT_OTHER): Payer: Self-pay | Admitting: Surgery

## 2020-03-29 ENCOUNTER — Other Ambulatory Visit: Payer: Self-pay

## 2020-03-29 ENCOUNTER — Encounter (HOSPITAL_BASED_OUTPATIENT_CLINIC_OR_DEPARTMENT_OTHER)
Admission: RE | Disposition: A | Discharge status: Home / Self Care | Payer: Self-pay | Source: Home / Self Care | Attending: Surgery

## 2020-03-29 ENCOUNTER — Ambulatory Visit (HOSPITAL_BASED_OUTPATIENT_CLINIC_OR_DEPARTMENT_OTHER): Payer: Commercial Managed Care - PPO | Admitting: Anesthesiology

## 2020-03-29 DIAGNOSIS — D1721 Benign lipomatous neoplasm of skin and subcutaneous tissue of right arm: Secondary | ICD-10-CM | POA: Diagnosis not present

## 2020-03-29 DIAGNOSIS — F17290 Nicotine dependence, other tobacco product, uncomplicated: Secondary | ICD-10-CM | POA: Diagnosis not present

## 2020-03-29 DIAGNOSIS — R2232 Localized swelling, mass and lump, left upper limb: Secondary | ICD-10-CM | POA: Diagnosis present

## 2020-03-29 HISTORY — PX: LIPOMA EXCISION: SHX5283

## 2020-03-29 SURGERY — EXCISION LIPOMA
Anesthesia: General | Site: Arm Upper | Laterality: Right

## 2020-03-29 MED ORDER — SCOPOLAMINE 1 MG/3DAYS TD PT72
MEDICATED_PATCH | TRANSDERMAL | Status: AC
  Filled 2020-03-29: qty 1

## 2020-03-29 MED ORDER — LIDOCAINE-EPINEPHRINE (PF) 1 %-1:200000 IJ SOLN
INTRAMUSCULAR | Status: AC
  Filled 2020-03-29: qty 30

## 2020-03-29 MED ORDER — BUPIVACAINE HCL (PF) 0.5 % IJ SOLN
INTRAMUSCULAR | Status: AC
  Filled 2020-03-29: qty 30

## 2020-03-29 MED ORDER — DEXAMETHASONE SODIUM PHOSPHATE 10 MG/ML IJ SOLN
INTRAMUSCULAR | Status: AC
  Filled 2020-03-29: qty 1

## 2020-03-29 MED ORDER — HYDROCODONE-ACETAMINOPHEN 5-325 MG PO TABS: 1.0000 | ORAL_TABLET | Freq: Four times a day (QID) | ORAL | 0 refills | Status: DC | PRN

## 2020-03-29 MED ORDER — SODIUM BICARBONATE 4.2 % IV SOLN
INTRAVENOUS | Status: AC
  Filled 2020-03-29: qty 10

## 2020-03-29 MED ORDER — ACETAMINOPHEN 500 MG PO TABS
1000.0000 mg | ORAL_TABLET | ORAL | Status: AC
  Administered 2020-03-29: 1000 mg via ORAL

## 2020-03-29 MED ORDER — LACTATED RINGERS IV SOLN: INTRAVENOUS | Status: DC

## 2020-03-29 MED ORDER — FENTANYL CITRATE (PF) 100 MCG/2ML IJ SOLN
INTRAMUSCULAR | Status: AC
  Filled 2020-03-29: qty 2

## 2020-03-29 MED ORDER — 0.9 % SODIUM CHLORIDE (POUR BTL) OPTIME
TOPICAL | Status: DC | PRN
  Administered 2020-03-29: 500 mL

## 2020-03-29 MED ORDER — SUGAMMADEX SODIUM 200 MG/2ML IV SOLN
INTRAVENOUS | Status: DC | PRN
  Administered 2020-03-29: 200 mg via INTRAVENOUS

## 2020-03-29 MED ORDER — LIDOCAINE HCL (PF) 1 % IJ SOLN
INTRAMUSCULAR | Status: DC | PRN
  Administered 2020-03-29: 10 mL

## 2020-03-29 MED ORDER — CEFAZOLIN SODIUM-DEXTROSE 2-4 GM/100ML-% IV SOLN
2.0000 g | INTRAVENOUS | Status: AC
  Administered 2020-03-29: 2 g via INTRAVENOUS

## 2020-03-29 MED ORDER — FENTANYL CITRATE (PF) 100 MCG/2ML IJ SOLN
INTRAMUSCULAR | Status: DC | PRN
  Administered 2020-03-29: 50 ug via INTRAVENOUS
  Administered 2020-03-29: 100 ug via INTRAVENOUS

## 2020-03-29 MED ORDER — LIDOCAINE HCL (PF) 1 % IJ SOLN
INTRAMUSCULAR | Status: AC
  Filled 2020-03-29: qty 30

## 2020-03-29 MED ORDER — ROCURONIUM BROMIDE 100 MG/10ML IV SOLN
INTRAVENOUS | Status: DC | PRN
  Administered 2020-03-29: 100 mg via INTRAVENOUS

## 2020-03-29 MED ORDER — MIDAZOLAM HCL 2 MG/2ML IJ SOLN
INTRAMUSCULAR | Status: AC
  Filled 2020-03-29: qty 2

## 2020-03-29 MED ORDER — MIDAZOLAM HCL 5 MG/5ML IJ SOLN
INTRAMUSCULAR | Status: DC | PRN
  Administered 2020-03-29: 2 mg via INTRAVENOUS

## 2020-03-29 MED ORDER — SCOPOLAMINE 1 MG/3DAYS TD PT72
1.0000 | MEDICATED_PATCH | TRANSDERMAL | Status: DC
  Administered 2020-03-29: 1.5 mg via TRANSDERMAL

## 2020-03-29 MED ORDER — PROPOFOL 10 MG/ML IV BOLUS
INTRAVENOUS | Status: AC
  Filled 2020-03-29: qty 20

## 2020-03-29 MED ORDER — ONDANSETRON HCL 4 MG/2ML IJ SOLN
INTRAMUSCULAR | Status: AC
  Filled 2020-03-29: qty 2

## 2020-03-29 MED ORDER — DEXAMETHASONE SODIUM PHOSPHATE 4 MG/ML IJ SOLN
INTRAMUSCULAR | Status: DC | PRN
  Administered 2020-03-29: 10 mg via INTRAVENOUS

## 2020-03-29 MED ORDER — CEFAZOLIN SODIUM-DEXTROSE 2-4 GM/100ML-% IV SOLN
INTRAVENOUS | Status: AC
  Filled 2020-03-29: qty 100

## 2020-03-29 MED ORDER — CHLORHEXIDINE GLUCONATE CLOTH 2 % EX PADS: 6.0000 | MEDICATED_PAD | Freq: Once | CUTANEOUS | Status: DC

## 2020-03-29 MED ORDER — LIDOCAINE HCL (CARDIAC) PF 100 MG/5ML IV SOSY
PREFILLED_SYRINGE | INTRAVENOUS | Status: DC | PRN
  Administered 2020-03-29: 100 mg via INTRAVENOUS

## 2020-03-29 MED ORDER — ONDANSETRON HCL 4 MG/2ML IJ SOLN
INTRAMUSCULAR | Status: DC | PRN
  Administered 2020-03-29: 4 mg via INTRAVENOUS

## 2020-03-29 MED ORDER — HYDROMORPHONE HCL 1 MG/ML IJ SOLN
INTRAMUSCULAR | Status: AC
  Filled 2020-03-29: qty 0.5

## 2020-03-29 MED ORDER — PROPOFOL 10 MG/ML IV BOLUS
INTRAVENOUS | Status: DC | PRN
  Administered 2020-03-29: 200 mg via INTRAVENOUS

## 2020-03-29 MED ORDER — HYDROMORPHONE HCL 1 MG/ML IJ SOLN
0.2500 mg | INTRAMUSCULAR | Status: DC | PRN
  Administered 2020-03-29 (×2): 0.25 mg via INTRAVENOUS

## 2020-03-29 MED ORDER — ACETAMINOPHEN 500 MG PO TABS
ORAL_TABLET | ORAL | Status: AC
  Filled 2020-03-29: qty 2

## 2020-03-29 MED ORDER — LIDOCAINE 2% (20 MG/ML) 5 ML SYRINGE
INTRAMUSCULAR | Status: AC
  Filled 2020-03-29: qty 5

## 2020-03-29 SURGICAL SUPPLY — 48 items
BENZOIN TINCTURE PRP APPL 2/3 (GAUZE/BANDAGES/DRESSINGS) IMPLANT
BLADE CLIPPER SURG (BLADE) IMPLANT
BLADE SURG 15 STRL LF DISP TIS (BLADE) ×1 IMPLANT
BLADE SURG 15 STRL SS (BLADE) ×3
CANISTER SUCT 1200ML W/VALVE (MISCELLANEOUS) IMPLANT
CLEANER CAUTERY TIP 5X5 PAD (MISCELLANEOUS) ×1 IMPLANT
CLOSURE WOUND 1/2 X4 (GAUZE/BANDAGES/DRESSINGS)
COVER BACK TABLE 60X90IN (DRAPES) ×3 IMPLANT
COVER MAYO STAND STRL (DRAPES) ×3 IMPLANT
COVER WAND RF STERILE (DRAPES) IMPLANT
DECANTER SPIKE VIAL GLASS SM (MISCELLANEOUS) ×3 IMPLANT
DERMABOND ADVANCED (GAUZE/BANDAGES/DRESSINGS) ×2
DERMABOND ADVANCED .7 DNX12 (GAUZE/BANDAGES/DRESSINGS) ×1 IMPLANT
DRAPE LAPAROTOMY 100X72 PEDS (DRAPES) IMPLANT
DRAPE U-SHAPE 76X120 STRL (DRAPES) IMPLANT
ELECT REM PT RETURN 9FT ADLT (ELECTROSURGICAL) ×3
ELECTRODE REM PT RTRN 9FT ADLT (ELECTROSURGICAL) ×1 IMPLANT
GAUZE SPONGE 4X4 12PLY STRL LF (GAUZE/BANDAGES/DRESSINGS) ×3 IMPLANT
GLOVE BIO SURGEON STRL SZ8 (GLOVE) ×3 IMPLANT
GOWN STRL REUS W/ TWL LRG LVL3 (GOWN DISPOSABLE) ×1 IMPLANT
GOWN STRL REUS W/ TWL XL LVL3 (GOWN DISPOSABLE) ×1 IMPLANT
GOWN STRL REUS W/TWL LRG LVL3 (GOWN DISPOSABLE) ×3
GOWN STRL REUS W/TWL XL LVL3 (GOWN DISPOSABLE) ×3
NEEDLE HYPO 25X1 1.5 SAFETY (NEEDLE) ×3 IMPLANT
NEEDLE PRECISIONGLIDE 27X1.5 (NEEDLE) IMPLANT
NS IRRIG 1000ML POUR BTL (IV SOLUTION) IMPLANT
PACK BASIN DAY SURGERY FS (CUSTOM PROCEDURE TRAY) ×3 IMPLANT
PAD CLEANER CAUTERY TIP 5X5 (MISCELLANEOUS) ×2
PENCIL SMOKE EVACUATOR (MISCELLANEOUS) ×3 IMPLANT
SLEEVE SCD COMPRESS KNEE MED (MISCELLANEOUS) ×3 IMPLANT
STRIP CLOSURE SKIN 1/2X4 (GAUZE/BANDAGES/DRESSINGS) IMPLANT
SUT ETHILON 3 0 FSL (SUTURE) IMPLANT
SUT ETHILON 4 0 PS 2 18 (SUTURE) ×3 IMPLANT
SUT ETHILON 5 0 PS 2 18 (SUTURE) IMPLANT
SUT MON AB 4-0 PS1 27 (SUTURE) ×3 IMPLANT
SUT VIC AB 3-0 SH 27 (SUTURE)
SUT VIC AB 3-0 SH 27X BRD (SUTURE) IMPLANT
SUT VIC AB 4-0 SH 18 (SUTURE) ×3 IMPLANT
SUT VIC AB 5-0 PS2 18 (SUTURE) IMPLANT
SUT VICRYL 3-0 CR8 SH (SUTURE) ×3 IMPLANT
SYR BULB EAR ULCER 3OZ GRN STR (SYRINGE) ×3 IMPLANT
SYR CONTROL 10ML LL (SYRINGE) ×3 IMPLANT
TOWEL GREEN STERILE FF (TOWEL DISPOSABLE) ×3 IMPLANT
TRAY DSU PREP LF (CUSTOM PROCEDURE TRAY) ×3 IMPLANT
TUBE CONNECTING 20'X1/4 (TUBING)
TUBE CONNECTING 20X1/4 (TUBING) IMPLANT
UNDERPAD 30X36 HEAVY ABSORB (UNDERPADS AND DIAPERS) IMPLANT
YANKAUER SUCT BULB TIP NO VENT (SUCTIONS) IMPLANT

## 2020-03-29 NOTE — Transfer of Care (Signed)
Immediate Anesthesia Transfer of Care Note  Patient: Trevor Ramos  Procedure(s) Performed: EXCISION LARGE LIPOMA OF RIGHT ARM 9x12 CM, 6 CM deep (Right Arm Upper)  Patient Location: PACU  Anesthesia Type:General  Level of Consciousness: awake, alert  and oriented  Airway & Oxygen Therapy: Patient connected to nasal cannula oxygen  Post-op Assessment: Report given to RN and Post -op Vital signs reviewed and stable  Post vital signs: Reviewed and stable  Last Vitals:  Vitals Value Taken Time  BP    Temp    Pulse 103 03/29/20 1104  Resp 18 03/29/20 1104  SpO2 100 % 03/29/20 1104  Vitals shown include unvalidated device data.  Last Pain:  Vitals:   03/29/20 0755  TempSrc: Oral  PainSc: 6          Complications: No complications documented.

## 2020-03-29 NOTE — Anesthesia Preprocedure Evaluation (Addendum)
Anesthesia Evaluation  Patient identified by MRN, date of birth, ID band Patient awake    Reviewed: Allergy & Precautions, H&P , NPO status , Patient's Chart, lab work & pertinent test results  Airway Mallampati: II  TM Distance: >3 FB Neck ROM: Full    Dental no notable dental hx. (+) Teeth Intact, Dental Advisory Given   Pulmonary neg pulmonary ROS, Current Smoker and Patient abstained from smoking.,    Pulmonary exam normal breath sounds clear to auscultation       Cardiovascular negative cardio ROS   Rhythm:Regular Rate:Normal     Neuro/Psych negative neurological ROS  negative psych ROS   GI/Hepatic Neg liver ROS, GERD  Medicated and Controlled,  Endo/Other  negative endocrine ROS  Renal/GU negative Renal ROS  negative genitourinary   Musculoskeletal   Abdominal   Peds  Hematology negative hematology ROS (+)   Anesthesia Other Findings   Reproductive/Obstetrics negative OB ROS                            Anesthesia Physical Anesthesia Plan  ASA: II  Anesthesia Plan: General   Post-op Pain Management:    Induction: Intravenous  PONV Risk Score and Plan: 2 and Ondansetron, Dexamethasone and Midazolam  Airway Management Planned: Oral ETT  Additional Equipment:   Intra-op Plan:   Post-operative Plan: Extubation in OR  Informed Consent: I have reviewed the patients History and Physical, chart, labs and discussed the procedure including the risks, benefits and alternatives for the proposed anesthesia with the patient or authorized representative who has indicated his/her understanding and acceptance.     Dental advisory given  Plan Discussed with: CRNA  Anesthesia Plan Comments:        Anesthesia Quick Evaluation

## 2020-03-29 NOTE — Anesthesia Postprocedure Evaluation (Signed)
Anesthesia Post Note  Patient: Trevor Ramos  Procedure(s) Performed: EXCISION LARGE LIPOMA OF RIGHT ARM 9x12 CM, 6 CM deep (Right Arm Upper)     Patient location during evaluation: PACU Anesthesia Type: General Level of consciousness: awake and alert Pain management: pain level controlled Vital Signs Assessment: post-procedure vital signs reviewed and stable Respiratory status: spontaneous breathing, nonlabored ventilation, respiratory function stable and patient connected to nasal cannula oxygen Cardiovascular status: blood pressure returned to baseline and stable Postop Assessment: no apparent nausea or vomiting Anesthetic complications: no   No complications documented.  Last Vitals:  Vitals:   03/29/20 1146 03/29/20 1204  BP:  (!) 129/95  Pulse: 81 83  Resp: 15 16  Temp:  36.5 C  SpO2: 98% 99%    Last Pain:  Vitals:   03/29/20 1204  TempSrc: Oral  PainSc:                  Trevor Ramos DAVID

## 2020-03-29 NOTE — Discharge Instructions (Signed)
     Lipoma Removal, Care After This sheet gives you information about how to care for yourself after your procedure. Your health care provider may also give you more specific instructions. If you have problems or questions, contact your health care provider. What can I expect after the procedure? After the procedure, it is common to have:  Mild pain.  Swelling.  Bruising. Follow these instructions at home: Bathing   Do not take baths, swim, or use a hot tub until your health care provider approves. Ask your health care provider if you may take showers. You may only be allowed to take sponge baths.  Keep your bandage (dressing) dry until your health care provider says it can be removed. Incision care   Follow instructions from your health care provider about how to take care of your incision. Make sure you: ? Wash your hands with soap and water for at least 20 seconds before and after you change your dressing. If soap and water are not available, use hand sanitizer. ? Change your dressing as told by your health care provider. ? Leave stitches (sutures), skin glue, or adhesive strips in place. These skin closures may need to stay in place for 2 weeks or longer. If adhesive strip edges start to loosen and curl up, you may trim the loose edges. Do not remove adhesive strips completely unless your health care provider tells you to do that.  Check your incision area every day for signs of infection. Check for: ? More redness, swelling, or pain. ? Fluid or blood. ? Warmth. ? Pus or a bad smell. Medicines  Take over-the-counter and prescription medicines only as told by your health care provider.  If you were prescribed an antibiotic medicine, use it as told by your health care provider. Do not stop using the antibiotic even if you start to feel better. General instructions   If you were given a sedative during the procedure, it can affect you for several hours. Do not drive or  operate machinery until your health care provider says that it is safe.  Do not use any products that contain nicotine or tobacco, such as cigarettes, e-cigarettes, and chewing tobacco. These can delay healing. If you need help quitting, ask your health care provider.  Return to your normal activities as told by your health care provider. Ask your health care provider what activities are safe for you.  Keep all follow-up visits as told by your health care provider. This is important. Contact a health care provider if:  You have more redness, swelling, or pain around your incision.  You have fluid or blood coming from your incision.  Your incision feels warm to the touch.  You have pus or a bad smell coming from your incision.  You have pain that does not get better with medicine. Get help right away if:  You have chills or a fever.  You have severe pain. Summary  After the procedure, it is common to have mild pain, swelling, and bruising.  Follow instructions from your health care provider about how to take care of your incision.  Check your incision area every day for signs of infection.  Contact a health care provider if you have more redness, swelling, or pain around your incision. This information is not intended to replace advice given to you by your health care provider. Make sure you discuss any questions you have with your health care provider. Document Revised: 04/24/2019 Document Reviewed: 04/24/2019 [employer]   [employer]     Post Anesthesia Home Care Instructions  Activity: Get plenty of rest for the remainder of the day. A responsible individual must stay with you for 24 hours following the procedure.  For the next 24 hours, DO NOT: -Drive a car -Paediatric nurse -Drink alcoholic beverages -Take any medication unless instructed by your physician -Make any legal decisions or sign important papers.  Meals: Start with liquid foods such as  gelatin or soup. Progress to regular foods as tolerated. Avoid greasy, spicy, heavy foods. If nausea and/or vomiting occur, drink only clear liquids until the nausea and/or vomiting subsides. Call your physician if vomiting continues.  Special Instructions/Symptoms: Your throat may feel dry or sore from the anesthesia or the breathing tube placed in your throat during surgery. If this causes discomfort, gargle with warm salt water. The discomfort should disappear within 24 hours.  If you had a scopolamine patch placed behind your ear for the management of post- operative nausea and/or vomiting:  1. The medication in the patch is effective for 72 hours, after which it should be removed.  Wrap patch in a tissue and discard in the trash. Wash hands thoroughly with soap and water. 2. You may remove the patch earlier than 72 hours if you experience unpleasant side effects which may include dry mouth, dizziness or visual disturbances. 3. Avoid touching the patch. Wash your hands with soap and water after contact with the patch.      Call your surgeon if you experience:   1.  Fever over 101.0. 2.  Inability to urinate. 3.  Nausea and/or vomiting. 4.  Extreme swelling or bruising at the surgical site. 5.  Continued bleeding from the incision. 6.  Increased pain, redness or drainage from the incision. 7.  Problems related to your pain medication. 8.  Any problems and/or concerns

## 2020-03-29 NOTE — Anesthesia Procedure Notes (Signed)
Procedure Name: Intubation Performed by: Verita Lamb, CRNA Pre-anesthesia Checklist: Patient identified, Emergency Drugs available, Suction available and Patient being monitored Patient Re-evaluated:Patient Re-evaluated prior to induction Oxygen Delivery Method: Circle system utilized Preoxygenation: Pre-oxygenation with 100% oxygen Induction Type: IV induction Ventilation: Mask ventilation without difficulty Laryngoscope Size: Mac and 4 Grade View: Grade I Tube type: Oral Tube size: 7.0 mm Number of attempts: 1 Airway Equipment and Method: Stylet and Oral airway Placement Confirmation: ETT inserted through vocal cords under direct vision,  positive ETCO2,  breath sounds checked- equal and bilateral and CO2 detector Secured at: 23 cm Tube secured with: Tape Dental Injury: Teeth and Oropharynx as per pre-operative assessment

## 2020-03-29 NOTE — Op Note (Addendum)
Sigfredo Schreier Greenfeld  07/16/1971   03/29/2020    PCP:  System, Pcp Not In   Surgeon: Kaylyn Lim, MD, FACS  Asst:  none  Anes:  general  Preop Dx: Large fatty mass of right back/shoulder Postop Dx: Same path pending  Procedure: Excision of large mass left shoulder     Location Surgery: [facility] 1 Complications: none  EBL:   minimal cc  Drains: none  Description of Procedure:  The patient was taken to OR 1 .  After anesthesia was administered and the patient was prepped  with [employer]are  and a timeout was performed.  Patient was in the left decubitus position with his arm reflected over to expose this large mass.  Because it had had effectively stretched the skin I went ahead and excised the lips of skin approximately 6 cm in length.  Following this incision I then raised flaps superiorly inferiorly medial and lateral and used finger dissection to excise a multilobulated lipomatous mass from the subcutaneous tissue down to the muscle.  This was excised using the Bovie as well as some Metzenbaum scissor dissection.  This was completely excised in toto and no other portions the mass were palpable.  It was measured at the end of the case and these measurements were given to the nurse but was basically 12 cm x 9 cm in 6 cm thick.  The wound was injected with lidocaine with epinephrine and was closed in layers with 3-0 Vicryl pop offs and then with these in the subcutaneous tissue with a running subcuticular 4-0 Monocryl and then 3 sutures of 4-0 nylon in a vertical mattress fashion along with Dermabond.  The patient tolerated the procedure well and was taken to the PACU in stable condition.     Matt B. Hassell Done, [place], [facility], [place]

## 2020-03-29 NOTE — Interval H&P Note (Signed)
History and Physical Interval Note:  03/29/2020 9:26 AM  Trevor Ramos  has presented today for surgery, with the diagnosis of 7cm LARGE LIPOMA.  The various methods of treatment have been discussed with the patient and family. After consideration of risks, benefits and other options for treatment, the patient has consented to  Procedure(s): EXCISION LARGE LIPOMA OF RIGHT ARM 7X7 CM (Right) as a surgical intervention.  The patient's history has been reviewed, patient examined, no change in status, stable for surgery.  I have reviewed the patient's chart and labs.  Questions were answered to the patient's satisfaction.     [name]

## 2020-04-01 ENCOUNTER — Encounter (HOSPITAL_BASED_OUTPATIENT_CLINIC_OR_DEPARTMENT_OTHER): Payer: Self-pay | Admitting: Surgery

## 2020-04-01 LAB — SURGICAL PATHOLOGY

## 2021-02-05 ENCOUNTER — Other Ambulatory Visit: Payer: Self-pay | Admitting: Orthopedic Surgery

## 2021-02-05 DIAGNOSIS — M5416 Radiculopathy, lumbar region: Secondary | ICD-10-CM

## 2021-02-28 ENCOUNTER — Other Ambulatory Visit: Payer: Commercial Managed Care - PPO

## 2021-03-03 ENCOUNTER — Ambulatory Visit
Admission: RE | Admit: 2021-03-03 | Discharge: 2021-03-03 | Disposition: A | Discharge status: Home / Self Care | Payer: Commercial Managed Care - PPO | Source: Ambulatory Visit | Attending: Orthopedic Surgery | Admitting: Orthopedic Surgery

## 2021-03-03 DIAGNOSIS — M5416 Radiculopathy, lumbar region: Secondary | ICD-10-CM

## 2021-11-18 ENCOUNTER — Encounter (HOSPITAL_COMMUNITY): Payer: Self-pay

## 2021-11-18 ENCOUNTER — Emergency Department (HOSPITAL_COMMUNITY)
Admission: EM | Admit: 2021-11-18 | Discharge: 2021-11-18 | Disposition: A | Discharge status: Home / Self Care | Payer: Commercial Managed Care - PPO | Attending: Emergency Medicine | Admitting: Emergency Medicine

## 2021-11-18 ENCOUNTER — Emergency Department (HOSPITAL_COMMUNITY): Payer: Commercial Managed Care - PPO

## 2021-11-18 ENCOUNTER — Emergency Department (HOSPITAL_COMMUNITY)
Admit: 2021-11-18 | Discharge: 2021-11-18 | Disposition: A | Discharge status: Home / Self Care | Payer: Commercial Managed Care - PPO | Attending: Emergency Medicine | Admitting: Emergency Medicine

## 2021-11-18 DIAGNOSIS — K219 Gastro-esophageal reflux disease without esophagitis: Secondary | ICD-10-CM | POA: Diagnosis not present

## 2021-11-18 DIAGNOSIS — R0789 Other chest pain: Secondary | ICD-10-CM | POA: Diagnosis not present

## 2021-11-18 DIAGNOSIS — R1011 Right upper quadrant pain: Secondary | ICD-10-CM

## 2021-11-18 DIAGNOSIS — R109 Unspecified abdominal pain: Secondary | ICD-10-CM | POA: Diagnosis present

## 2021-11-18 LAB — HEPATIC FUNCTION PANEL
ALT: 29 U/L (ref 0–44)
AST: 22 U/L (ref 15–41)
Albumin: 3.9 g/dL (ref 3.5–5.0)
Alkaline Phosphatase: 63 U/L (ref 38–126)
Bilirubin, Direct: 0.1 mg/dL (ref 0.0–0.2)
Indirect Bilirubin: 0.3 mg/dL (ref 0.3–0.9)
Total Bilirubin: 0.4 mg/dL (ref 0.3–1.2)
Total Protein: 7.7 g/dL (ref 6.5–8.1)

## 2021-11-18 LAB — CBC
HCT: 43.2 % (ref 39.0–52.0)
Hemoglobin: 14 g/dL (ref 13.0–17.0)
MCH: 23.9 pg — ABNORMAL LOW (ref 26.0–34.0)
MCHC: 32.4 g/dL (ref 30.0–36.0)
MCV: 73.8 fL — ABNORMAL LOW (ref 80.0–100.0)
Platelets: 259 10*3/uL (ref 150–400)
RBC: 5.85 MIL/uL — ABNORMAL HIGH (ref 4.22–5.81)
RDW: 15.5 % (ref 11.5–15.5)
WBC: 5.9 10*3/uL (ref 4.0–10.5)
nRBC: 0 % (ref 0.0–0.2)

## 2021-11-18 LAB — BASIC METABOLIC PANEL
Anion gap: 6 (ref 5–15)
BUN: 10 mg/dL (ref 6–20)
CO2: 25 mmol/L (ref 22–32)
Calcium: 9.1 mg/dL (ref 8.9–10.3)
Chloride: 105 mmol/L (ref 98–111)
Creatinine, Ser: 0.9 mg/dL (ref 0.61–1.24)
GFR, Estimated: >60 mL/min (ref >60)
Glucose, Bld: 85 mg/dL (ref 70–99)
Potassium: 3.8 mmol/L (ref 3.5–5.1)
Sodium: 136 mmol/L (ref 135–145)

## 2021-11-18 LAB — TROPONIN I (HIGH SENSITIVITY)
Troponin I (High Sensitivity): 5 ng/L (ref <18)
Troponin I (High Sensitivity): 4 ng/L (ref <18)

## 2021-11-18 LAB — D-DIMER, QUANTITATIVE: D-Dimer, Quant: 0.77 ug/mL-FEU — ABNORMAL HIGH (ref 0.00–0.50)

## 2021-11-18 LAB — LIPASE, BLOOD: Lipase: 36 U/L (ref 11–51)

## 2021-11-18 MED ORDER — LIDOCAINE VISCOUS HCL 2 % MT SOLN
15.0000 mL | Freq: Once | OROMUCOSAL | Status: AC
  Administered 2021-11-18: 15 mL via ORAL
  Filled 2021-11-18: qty 15

## 2021-11-18 MED ORDER — LIDOCAINE 5 % EX PTCH: 1.0000 | MEDICATED_PATCH | CUTANEOUS | 0 refills | Status: DC

## 2021-11-18 MED ORDER — LIDOCAINE 5 % EX PTCH
1.0000 | MEDICATED_PATCH | CUTANEOUS | Status: DC
  Administered 2021-11-18: 1 via TRANSDERMAL
  Filled 2021-11-18: qty 1

## 2021-11-18 MED ORDER — IOHEXOL 350 MG/ML SOLN
80.0000 mL | Freq: Once | INTRAVENOUS | Status: AC | PRN
  Administered 2021-11-18: 80 mL via INTRAVENOUS

## 2021-11-18 MED ORDER — PANTOPRAZOLE SODIUM 40 MG PO TBEC: 40.0000 mg | DELAYED_RELEASE_TABLET | Freq: Every day | ORAL | 0 refills | Status: DC

## 2021-11-18 MED ORDER — FAMOTIDINE 20 MG PO TABS: 20.0000 mg | ORAL_TABLET | Freq: Two times a day (BID) | ORAL | 0 refills | Status: DC

## 2021-11-18 MED ORDER — ALUM & MAG HYDROXIDE-SIMETH 200-200-20 MG/5ML PO SUSP
30.0000 mL | Freq: Once | ORAL | Status: AC
  Administered 2021-11-18: 30 mL via ORAL
  Filled 2021-11-18: qty 30

## 2021-11-18 NOTE — ED Notes (Signed)
US at bedside

## 2021-11-18 NOTE — Discharge Instructions (Signed)
You were evaluated in the Emergency Department and after careful evaluation, we did not find any emergent condition requiring admission or further testing in the hospital.  Your exam/testing today was overall reassuring.  Your right upper quadrant ultrasound was normal, your DVT ultrasound was negative for DVT and your PE study was negative for acute PE.  Your cardiac enzymes were normal and your EKG did not reveal acute changes.  You have chest wall reproducible tenderness to palpation on the right which may be due to musculoskeletal strain or costochondritis.  You could also have pleurisy.  This is all managed by NSAIDs.  Additionally, you do have a history of GERD and some symptoms concerning for gastroesophageal reflux.  We will start you on a 30-day course of Pepcid and Protonix.  Please return to the Emergency Department if you experience any worsening of your condition.  Thank you for allowing [place] to be a part of your care.

## 2021-11-18 NOTE — ED Provider Notes (Signed)
[facility] DEPT Provider Note   CSN: [id number] Arrival date & time: 11/18/21  1412     History  Chief Complaint  Patient presents with   Chest Pain   Leg Pain    Trevor Ramos is a 51 y.o. male.   Chest Pain Associated symptoms: abdominal pain and back pain   Leg Pain Associated symptoms: back pain       51 year old male with a history of GERD, low back pain and sciatica presenting with chest pain and leg pain. He states that his pain came suddenly yesterday. It was in the middle and radiated to the right chest to his back. He was lying flat when it came on. It was sharp and pleuritic. Hurts to move his arms. He also has had chronic in his left leg. Feels as if one of his veins could have a clot in it and is worried about this. Denies any swelling in his legs, unilateral or bilateral. Had previous leg weakness that was evaluated by MRI of the spine which was normal. He occasionally feels as if his leg will 'give out at times.'   He has never had a blood clot before. No recent travel or surgeries. States he is still experiencing chest pain and tightness. Has a hx of reflux and has been taking OTC medications for it.   Home Medications Prior to Admission medications   Medication Sig Start Date End Date Taking? Authorizing Provider  famotidine (PEPCID) 20 MG tablet Take 1 tablet (20 mg total) by mouth 2 (two) times daily. 11/18/21 12/18/21 Yes Regan Lemming, MD  lidocaine (LIDODERM) 5 % Place 1 patch onto the skin daily. Remove & Discard patch within 12 hours or as directed by MD 11/18/21  Yes Regan Lemming, MD  pantoprazole (PROTONIX) 40 MG tablet Take 1 tablet (40 mg total) by mouth daily. 11/18/21 12/18/21 Yes Regan Lemming, MD  HYDROcodone-acetaminophen (NORCO/VICODIN) 5-325 MG tablet Take 1 tablet by mouth every 6 (six) hours as needed for moderate pain. 03/29/20   Johnathan Hausen, MD  omeprazole (PRILOSEC) 20 MG capsule Take 20 mg by mouth daily  as needed.    [provider]      Allergies    Patient has no known allergies.    Review of Systems   Review of Systems  Cardiovascular:  Positive for chest pain.  Gastrointestinal:  Positive for abdominal pain.  Musculoskeletal:  Positive for back pain and myalgias.  All other systems reviewed and are negative.  Physical Exam Updated Vital Signs BP (!) 147/128    Pulse 68    Temp 98.5 F (36.9 C) (Oral)    Resp 15    SpO2 100%  Physical Exam Vitals and nursing note reviewed.  Constitutional:      General: He is not in acute distress. HENT:     Head: Normocephalic and atraumatic.  Eyes:     Conjunctiva/sclera: Conjunctivae normal.     Pupils: Pupils are equal, round, and reactive to light.  Cardiovascular:     Rate and Rhythm: Normal rate and regular rhythm.     Heart sounds: Normal heart sounds.  Pulmonary:     Effort: Pulmonary effort is normal. No respiratory distress.     Breath sounds: Normal breath sounds.  Chest:     Comments: Right-sided chest wall tenderness to palpation Abdominal:     General: There is no distension.     Tenderness: There is abdominal tenderness in the right upper  quadrant. There is no guarding or rebound.  Musculoskeletal:        General: No deformity or signs of injury.     Cervical back: Neck supple.  Skin:    Findings: No lesion or rash.  Neurological:     General: No focal deficit present.     Mental Status: He is alert. Mental status is at baseline.    ED Results / Procedures / Treatments   Labs (all labs ordered are listed, but only abnormal results are displayed) Labs Reviewed  CBC - Abnormal; Notable for the following components:      Result Value   RBC 5.85 (*)    MCV 73.8 (*)    MCH 23.9 (*)    All other components within normal limits  D-DIMER, QUANTITATIVE - Abnormal; Notable for the following components:   D-Dimer, Quant 0.77 (*)    All other components within normal limits  BASIC METABOLIC PANEL  LIPASE,  BLOOD  HEPATIC FUNCTION PANEL  TROPONIN I (HIGH SENSITIVITY)  TROPONIN I (HIGH SENSITIVITY)    EKG EKG Interpretation  Date/Time:  Tuesday November 18 2021 14:18:55 EST Ventricular Rate:  82 PR Interval:  144 QRS Duration: 96 QT Interval:  357 QTC Calculation: 417 R Axis:   63 Text Interpretation: Sinus rhythm Low voltage, precordial leads RSR' in V1 or V2, right VCD or RVH No significant change since last tracing Confirmed by Regan Lemming ([id number]) on 11/18/2021 6:01:22 PM  Radiology DG Chest 2 View  Result Date: 11/18/2021 CLINICAL DATA:  Chest pain. EXAM: CHEST - 2 VIEW COMPARISON:  Chest x-ray dated February 26, 2018. FINDINGS: The heart size and mediastinal contours are within normal limits. Both lungs are clear. The visualized skeletal structures are unremarkable. IMPRESSION: No active cardiopulmonary disease. Electronically Signed   By: Titus Dubin M.D.   On: 11/18/2021 14:56   CT Angio Chest PE W and/or Wo Contrast  Result Date: 11/18/2021 CLINICAL DATA:  Chest pain and elevated D-dimer. EXAM: CT ANGIOGRAPHY CHEST WITH CONTRAST TECHNIQUE: Multidetector CT imaging of the chest was performed using the standard protocol during bolus administration of intravenous contrast. Multiplanar CT image reconstructions and MIPs were obtained to evaluate the vascular anatomy. RADIATION DOSE REDUCTION: This exam was performed according to the departmental dose-optimization program which includes automated exposure control, adjustment of the mA and/or kV according to patient size and/or use of iterative reconstruction technique. CONTRAST:  71mL OMNIPAQUE IOHEXOL 350 MG/ML SOLN COMPARISON:  None. FINDINGS: Cardiovascular: The heart is mildly enlarged. No pericardial effusion. There is mild tortuosity of the thoracic aorta but no focal aneurysm dissection. No definite coronary artery calcifications. The pulmonary arterial tree is well opacified. No filling defects to suggest pulmonary embolism.  Mediastinum/Nodes: No mediastinal or hilar mass or adenopathy. The esophagus is grossly normal. Lungs/Pleura: No pulmonary infiltrates, pleural effusions or pulmonary edema. No worrisome pulmonary lesions. Upper Abdomen: Small left hepatic lobe cyst. No worrisome hepatic lesions. No adrenal gland lesions. No upper abdominal process. Musculoskeletal: No significant bony findings. Review of the MIP images confirms the above findings. IMPRESSION: 1. No CT findings for pulmonary embolism. 2. No acute pulmonary findings. 3. Mild cardiac enlargement. Electronically Signed   By: Marijo Sanes M.D.   On: 11/18/2021 17:48   VAS [place] LOWER EXTREMITY VENOUS (DVT) (ONLY [facility] & [facility])  Result Date: 11/18/2021  Lower Venous DVT Study Patient Name:  Trevor Ramos  Date of Exam:   11/18/2021 Medical Rec #: [id number]  Accession #:    [id number] Date of Birth: July 18, 1971         Patient Gender: M Patient Age:   51 years Exam Location:  [facility] Procedure:      VAS [place] LOWER EXTREMITY VENOUS (DVT) Referring Phys: Regan Lemming --------------------------------------------------------------------------------  Indications: Pain.  Risk Factors: None identified. Comparison Study: No prior studies. Performing Technologist: Oliver Hum RVT  Examination Guidelines: A complete evaluation includes B-mode imaging, spectral Doppler, color Doppler, and power Doppler as needed of all accessible portions of each vessel. Bilateral testing is considered an integral part of a complete examination. Limited examinations for reoccurring indications may be performed as noted. The reflux portion of the exam is performed with the patient in reverse Trendelenburg.  +-----+---------------+---------+-----------+----------+--------------+  RIGHT Compressibility Phasicity Spontaneity Properties Thrombus Aging  +-----+---------------+---------+-----------+----------+--------------+  CFV   Full            Yes       Yes                                     +-----+---------------+---------+-----------+----------+--------------+   +---------+---------------+---------+-----------+----------+--------------+  LEFT      Compressibility Phasicity Spontaneity Properties Thrombus Aging  +---------+---------------+---------+-----------+----------+--------------+  CFV       Full            Yes       Yes                                    +---------+---------------+---------+-----------+----------+--------------+  SFJ       Full                                                             +---------+---------------+---------+-----------+----------+--------------+  FV Prox   Full                                                             +---------+---------------+---------+-----------+----------+--------------+  FV Mid    Full                                                             +---------+---------------+---------+-----------+----------+--------------+  FV Distal Full                                                             +---------+---------------+---------+-----------+----------+--------------+  PFV       Full                                                             +---------+---------------+---------+-----------+----------+--------------+  POP       Full            Yes       Yes                                    +---------+---------------+---------+-----------+----------+--------------+  PTV       Full                                                             +---------+---------------+---------+-----------+----------+--------------+  PERO      Full                                                             +---------+---------------+---------+-----------+----------+--------------+    Summary: RIGHT: - No evidence of common femoral vein obstruction.  LEFT: - There is no evidence of deep vein thrombosis in the lower extremity.  - No cystic structure found in the popliteal fossa.  *See table(s) above for measurements and observations. Electronically  signed by Deitra Mayo MD on 11/18/2021 at 6:16:52 PM.    Final    US Abdomen Limited RUQ (LIVER/GB)  Result Date: 11/18/2021 CLINICAL DATA:  Right upper quadrant abdominal pain EXAM: ULTRASOUND ABDOMEN LIMITED RIGHT UPPER QUADRANT COMPARISON:  None. FINDINGS: Gallbladder: No gallstones or wall thickening visualized. No sonographic Murphy sign noted by sonographer. Common bile duct: Diameter: 0.2 cm Liver: No focal lesion identified. Within normal limits in parenchymal echogenicity. Portal vein is patent on color Doppler imaging with normal direction of blood flow towards the liver. Other: Reduced sensitivity and specificity in characterization of the liver due to overlying bowel gas. IMPRESSION: 1. No significant hepatobiliary sonographic abnormality. Mildly reduced sensitivity and specificity in characterization of the liver due to overlying bowel gas. Electronically Signed   By: Van Clines M.D.   On: 11/18/2021 16:31    Procedures Procedures    Medications Ordered in ED Medications  lidocaine (LIDODERM) 5 % 1 patch (has no administration in time range)  alum & mag hydroxide-simeth (MAALOX/MYLANTA) 200-200-20 MG/5ML suspension 30 mL (30 mLs Oral Given 11/18/21 1618)    And  lidocaine (XYLOCAINE) 2 % viscous mouth solution 15 mL (15 mLs Oral Given 11/18/21 1618)  iohexol (OMNIPAQUE) 350 MG/ML injection 80 mL (80 mLs Intravenous Contrast Given 11/18/21 1733)    ED Course/ Medical Decision Making/ A&P                           Medical Decision Making Amount and/or Complexity of Data Reviewed Labs: ordered. Radiology: ordered. ECG/medicine tests: ordered.  Risk OTC drugs. Prescription drug management.   51 year old male with a history of GERD, low back pain and sciatica presenting with chest pain and leg pain. He states that his pain came suddenly yesterday. It was in the middle and radiated to the right chest to his back. He was lying flat when it came on. It was sharp and  pleuritic. Hurts to move his arms. He also has had chronic in  his left leg. Feels as if one of his veins could have a clot in it and is worried about this. Denies any swelling in his legs, unilateral or bilateral. Had previous leg weakness that was evaluated by MRI of the spine which was normal. He occasionally feels as if his leg will 'give out at times.'   He has never had a blood clot before. No recent travel or surgeries. States he is still experiencing chest pain and tightness. Has a hx of reflux and has been taking OTC medications for it.  On arrival, the patient was afebrile, hemodynamically stable, mildly hypertensive BP 133/96, saturating 90% on room air.  Sinus rhythm noted on cardiac telemetry.  Patient presenting with right-sided chest wall pain that is mildly pleuritic.  Differential diagnosis includes PE, pneumonia, pneumothorax, cholecystitis/cholelithiasis, musculoskeletal chest pain, costochondritis.  Additionally, the patient complains of burning substernal chest pain, worse with lying flat.  He does have a history of GERD and symptoms sound similar to this.  His final complaint is concerning for possible PE versus musculoskeletal strain given pain in his left groin that has been going on "for weeks."  He endorses intermittent weakness in the left lower extremity.    He previously had MRI imaging in June which revealed the following: IMPRESSION: Degenerative disc disease at L5-S1 with a shallow disc protrusion more prominent towards the right. Contact of the ventral thecal sac and right S1 root sleeve. Definite nerve compression is not established, but this could relate to back pain or neural irritation. I do not see any left-sided predominant finding.   L4-5 facet osteoarthritis of a mild degree that could contribute to low back pain.  He has not followed with anyone regarding these findings, specifically has not followed up with any spine specialist.  On exam, the patient had  5 out of 5 strength in the bilateral lower extremities.  He has no red flag symptoms concerning for cauda equina syndrome.  He has no clear unilateral swelling.  A D-dimer was collected and resulted positive.  Lower extremity ultrasound on the left was performed to evaluate for possible DVT which resulted negative.  A CTA PE study was also obtained which resulted negative for acute PE, no evidence of pneumonia or pneumothorax.  The patient had an EKG which revealed no acute ischemic changes, no abnormal intervals, ventricular rate which 8 2, no change from prior EKGs.  Right upper quadrant ultrasound was performed which revealed no evidence of any hepatobiliary abnormality with no evidence of gallstones.  Screening laboratory work-up was performed to include troponins x2 which were negative, lipase normal, BMP unremarkable, hepatic function panel unremarkable, CBC without a leukocytosis or anemia.  The patient was administered viscous lidocaine and Maalox with subsequent improvement in his epigastric symptoms.  Suspect likely GERD.  Additionally, given the patient's reproducible musculoskeletal tenderness to palpation on exam, can suspect likely musculoskeletal chest pain.  Advised a very short course of NSAIDs, starting the patient on Pepcid and Protonix given his known GERD and follow-up outpatient.  Advised lidocaine patch for right-sided chest wall pain.  Low concern for ACS at this time.  The patient has been appropriately medically screened and/or stabilized in the ED. I have low suspicion for any other emergent medical condition which would require further screening, evaluation or treatment in the ED or require inpatient management.  Regarding the patient's intermittent left leg weakness and findings on MRI 6 months ago, the patient has no symptoms concerning for acute myelopathy or cauda  equina syndrome at this time.  Provided referral for outpatient neurosurgical follow-up.  Overall stable for  discharge.   Final Clinical Impression(s) / ED Diagnoses Final diagnoses:  RUQ pain  Gastroesophageal reflux disease, unspecified whether esophagitis present  Chest wall pain    Rx / DC Orders ED Discharge Orders          Ordered    famotidine (PEPCID) 20 MG tablet  2 times daily        11/18/21 1817    pantoprazole (PROTONIX) 40 MG tablet  Daily        11/18/21 1817    lidocaine (LIDODERM) 5 %  Every 24 hours        11/18/21 1832              Regan Lemming, MD 11/18/21 [phone]

## 2021-11-18 NOTE — ED Notes (Signed)
Pt NAD in bed, a/ox4. Pt states he began having 8/10 sharp substernal CP that radiated to right axillary region yesterday, with SOB and nausea. Pt states he has had CP before but there has never been any diagnoses. Pt states pt increases with deep inspiration. LS clear. -edema, orthopnea, exertional dyspnea.

## 2021-11-18 NOTE — ED Notes (Signed)
Patient transported to CT 

## 2021-11-18 NOTE — ED Notes (Signed)
Pt NAD, a/ox4. Pt verbalizes understanding of all DC and f/u instructions. All questions answered. Pt walks with steady gait to lobby at DC.  ? ?

## 2022-10-11 ENCOUNTER — Ambulatory Visit (HOSPITAL_COMMUNITY)
Admission: EM | Admit: 2022-10-11 | Discharge: 2022-10-11 | Disposition: A | Discharge status: Other Acute Inpatient Hospital | Payer: Self-pay | Attending: Emergency Medicine | Admitting: Emergency Medicine

## 2022-10-11 ENCOUNTER — Other Ambulatory Visit: Payer: Self-pay

## 2022-10-11 ENCOUNTER — Encounter (HOSPITAL_COMMUNITY): Payer: Self-pay | Admitting: *Deleted

## 2022-10-11 ENCOUNTER — Emergency Department (HOSPITAL_COMMUNITY)
Admission: EM | Admit: 2022-10-11 | Discharge: 2022-10-11 | Disposition: A | Discharge status: Home / Self Care | Payer: Self-pay | Attending: Emergency Medicine | Admitting: Emergency Medicine

## 2022-10-11 ENCOUNTER — Emergency Department (HOSPITAL_COMMUNITY): Payer: Self-pay

## 2022-10-11 ENCOUNTER — Emergency Department (HOSPITAL_COMMUNITY)
Admission: EM | Admit: 2022-10-11 | Discharge: 2022-10-11 | Disposition: A | Discharge status: Home / Self Care | Payer: Self-pay

## 2022-10-11 DIAGNOSIS — I4510 Unspecified right bundle-branch block: Secondary | ICD-10-CM

## 2022-10-11 DIAGNOSIS — R55 Syncope and collapse: Secondary | ICD-10-CM | POA: Insufficient documentation

## 2022-10-11 DIAGNOSIS — I10 Essential (primary) hypertension: Secondary | ICD-10-CM | POA: Insufficient documentation

## 2022-10-11 LAB — BASIC METABOLIC PANEL
Anion gap: 7 (ref 5–15)
BUN: 9 mg/dL (ref 6–20)
CO2: 23 mmol/L (ref 22–32)
Calcium: 8.9 mg/dL (ref 8.9–10.3)
Chloride: 106 mmol/L (ref 98–111)
Creatinine, Ser: 0.91 mg/dL (ref 0.61–1.24)
GFR, Estimated: >60 mL/min (ref >60)
Glucose, Bld: 111 mg/dL — ABNORMAL HIGH (ref 70–99)
Potassium: 3.5 mmol/L (ref 3.5–5.1)
Sodium: 136 mmol/L (ref 135–145)

## 2022-10-11 LAB — CBC WITH DIFFERENTIAL/PLATELET
Abs Immature Granulocytes: 0.02 10*3/uL (ref 0.00–0.07)
Basophils Absolute: 0 10*3/uL (ref 0.0–0.1)
Basophils Relative: 0 %
Eosinophils Absolute: 0 10*3/uL (ref 0.0–0.5)
Eosinophils Relative: 0 %
HCT: 43.5 % (ref 39.0–52.0)
Hemoglobin: 14.2 g/dL (ref 13.0–17.0)
Immature Granulocytes: 0 %
Lymphocytes Relative: 23 %
Lymphs Abs: 1.7 10*3/uL (ref 0.7–4.0)
MCH: 24 pg — ABNORMAL LOW (ref 26.0–34.0)
MCHC: 32.6 g/dL (ref 30.0–36.0)
MCV: 73.5 fL — ABNORMAL LOW (ref 80.0–100.0)
Monocytes Absolute: 0.5 10*3/uL (ref 0.1–1.0)
Monocytes Relative: 7 %
Neutro Abs: 5.2 10*3/uL (ref 1.7–7.7)
Neutrophils Relative %: 70 %
Platelets: 273 10*3/uL (ref 150–400)
RBC: 5.92 MIL/uL — ABNORMAL HIGH (ref 4.22–5.81)
RDW: 16 % — ABNORMAL HIGH (ref 11.5–15.5)
WBC: 7.5 10*3/uL (ref 4.0–10.5)
nRBC: 0 % (ref 0.0–0.2)

## 2022-10-11 LAB — HEPATIC FUNCTION PANEL
ALT: 23 U/L (ref 0–44)
AST: 22 U/L (ref 15–41)
Albumin: 3.5 g/dL (ref 3.5–5.0)
Alkaline Phosphatase: 58 U/L (ref 38–126)
Bilirubin, Direct: 0.1 mg/dL (ref 0.0–0.2)
Indirect Bilirubin: 0.1 mg/dL — ABNORMAL LOW (ref 0.3–0.9)
Total Bilirubin: 0.2 mg/dL — ABNORMAL LOW (ref 0.3–1.2)
Total Protein: 7.1 g/dL (ref 6.5–8.1)

## 2022-10-11 LAB — CBG MONITORING, ED: Glucose-Capillary: 91 mg/dL (ref 70–99)

## 2022-10-11 LAB — TROPONIN I (HIGH SENSITIVITY)
Troponin I (High Sensitivity): 6 ng/L (ref <18)
Troponin I (High Sensitivity): 6 ng/L (ref <18)

## 2022-10-11 MED ORDER — SODIUM CHLORIDE 0.9 % IV BOLUS
1000.0000 mL | Freq: Once | INTRAVENOUS | Status: AC
Start: 2022-10-11 — End: 2022-10-11
  Administered 2022-10-11: 1000 mL via INTRAVENOUS

## 2022-10-11 NOTE — ED Triage Notes (Signed)
Pt here with reports of syncopal event last night. Pt states ems on scene found pt BP to be 70/40. Given IVF. Today pt went to [facility] and sent here for further work up. Pt endorses decreased PO intake.

## 2022-10-11 NOTE — ED Provider Notes (Signed)
[facility] Provider Note   CSN: [id number] Arrival date & time: 10/11/22  1406     History  Chief Complaint  Patient presents with   Loss of Consciousness    Trevor Ramos is a 52 y.o. male.  52 y.o male with a PMH of HTN non compliant with medication presents to the ED from UC after syncope x 2 yesterday. Patient reports he was at a friends house when he felt dizzy while sitting down. He walked over the bathroom and reports passing out and striking the right side of his head with the counter.  According to bystanders, he reports he was told he was out for approximately 10 minutes, reports he actually had a second syncopal episode where he lost consciousness and was out for another 5 minutes.  Reports when EMS arrived yesterday noted his blood pressure to be 70/40, he received 350 cc of normal saline and blood pressure improved to 112/78.  He is currently not taking any medications, denies any prior hx of CAD, no family hx of CAD. No chest pain or shortness of breath.   The history is provided by the patient and medical records.  Loss of Consciousness Episode history:  Multiple Most recent episode:  Yesterday Duration:  5 minutes Progression:  Unchanged Chronicity:  New Context: dehydration, normal activity and sitting down   Associated symptoms: headaches   Associated symptoms: no chest pain, no fever, no nausea, no shortness of breath and no vomiting        Home Medications Prior to Admission medications   Medication Sig Start Date End Date Taking? Authorizing Provider  famotidine (PEPCID) 20 MG tablet Take 1 tablet (20 mg total) by mouth 2 (two) times daily. 11/18/21 12/18/21  Regan Lemming, MD  lidocaine (LIDODERM) 5 % Place 1 patch onto the skin daily. Remove & Discard patch within 12 hours or as directed by MD 11/18/21   Regan Lemming, MD  omeprazole (PRILOSEC) 20 MG capsule Take 20 mg by mouth daily as needed.    [provider]  pantoprazole (PROTONIX) 40 MG tablet Take 1 tablet (40 mg total) by mouth daily. 11/18/21 12/18/21  Regan Lemming, MD      Allergies    Patient has no known allergies.    Review of Systems   Review of Systems  Constitutional:  Negative for chills and fever.  HENT:  Negative for sore throat.   Eyes:  Negative for redness.  Respiratory:  Negative for shortness of breath.   Cardiovascular:  Positive for syncope. Negative for chest pain.  Gastrointestinal:  Negative for abdominal pain, nausea and vomiting.  Genitourinary:  Negative for flank pain.  Musculoskeletal:  Negative for back pain.  Skin:  Negative for pallor and wound.  Neurological:  Positive for syncope and headaches.  All other systems reviewed and are negative.   Physical Exam Updated Vital Signs BP (!) 143/96   Pulse 87   Temp 98.5 F (36.9 C) (Oral)   Resp 12   SpO2 100%  Physical Exam Vitals and nursing note reviewed.  Constitutional:      Appearance: Normal appearance.  HENT:     Head: Normocephalic.     Comments: Small abrasion to the right eyebrow    Nose: Nose normal.     Mouth/Throat:     Mouth: Mucous membranes are moist.  Eyes:     General: No scleral icterus. Cardiovascular:     Rate and Rhythm: Normal  rate.  Pulmonary:     Effort: Pulmonary effort is normal.     Breath sounds: No wheezing or rales.  Abdominal:     General: Abdomen is flat.     Tenderness: There is no abdominal tenderness.  Musculoskeletal:     Cervical back: Normal range of motion and neck supple.  Skin:    General: Skin is warm and dry.  Neurological:     Mental Status: He is alert and oriented to person, place, and time.     Comments: Alert, oriented, thought content appropriate. Speech fluent without evidence of aphasia. Able to follow 2 step commands without difficulty.  Cranial Nerves:  II:  Peripheral visual fields grossly normal, pupils, round, reactive to light III,IV, VI: ptosis not present,  extra-ocular motions intact bilaterally  V,VII: smile symmetric, facial light touch sensation equal VIII: hearing grossly normal bilaterally  IX,X: midline uvula rise  XI: bilateral shoulder shrug equal and strong XII: midline tongue extension  Motor:  5/5 in upper and lower extremities bilaterally including strong and equal grip strength and dorsiflexion/plantar flexion Sensory: light touch normal in all extremities.  Cerebellar: normal finger-to-nose with bilateral upper extremities, pronator drift negative Gait: normal gait and balance       ED Results / Procedures / Treatments   Labs (all labs ordered are listed, but only abnormal results are displayed) Labs Reviewed  CBC WITH DIFFERENTIAL/PLATELET - Abnormal; Notable for the following components:      Result Value   RBC 5.92 (*)    MCV 73.5 (*)    MCH 24.0 (*)    RDW 16.0 (*)    All other components within normal limits  BASIC METABOLIC PANEL - Abnormal; Notable for the following components:   Glucose, Bld 111 (*)    All other components within normal limits  HEPATIC FUNCTION PANEL  CBG MONITORING, ED  TROPONIN I (HIGH SENSITIVITY)  TROPONIN I (HIGH SENSITIVITY)    EKG EKG Interpretation  Date/Time:  Sunday October 11 2022 14:11:49 EST Ventricular Rate:  86 PR Interval:  144 QRS Duration: 110 QT Interval:  344 QTC Calculation: 411 R Axis:   84 Text Interpretation: Normal sinus rhythm Incomplete right bundle branch block Cannot rule out Anterior infarct , age undetermined T wave abnormality, consider inferior ischemia Abnormal ECG When compared with ECG of 11-Oct-2022 13:35, PREVIOUS ECG IS PRESENT No acute changes Confirmed by Isla Pence [id number]) on 10/11/2022 3:15:50 PM  Radiology CT HEAD WO CONTRAST (5MM)  Result Date: 10/11/2022 CLINICAL DATA:  Facial trauma, blunt EXAM: CT HEAD WITHOUT CONTRAST TECHNIQUE: Contiguous axial images were obtained from the base of the skull through the vertex without  intravenous contrast. RADIATION DOSE REDUCTION: This exam was performed according to the departmental dose-optimization program which includes automated exposure control, adjustment of the mA and/or kV according to patient size and/or use of iterative reconstruction technique. COMPARISON:  None Available. FINDINGS: Brain: No evidence of acute infarction, hemorrhage, hydrocephalus, extra-axial collection or mass lesion/mass effect. Mild patchy white matter hypodensities, nonspecific but most likely chronic microvascular ischemic disease. Vascular: No hyperdense vessel identified. Skull: No acute fracture. Sinuses/Orbits: Clear sinuses.  No acute orbital findings. Other: No mastoid effusions. IMPRESSION: No evidence of acute intracranial abnormality. Electronically Signed   By: Margaretha Sheffield M.D.   On: 10/11/2022 17:18   DG Chest 2 View  Result Date: 10/11/2022 CLINICAL DATA:  Syncope last night. EXAM: CHEST - 2 VIEW COMPARISON:  11/18/2021 FINDINGS: Lungs are adequately inflated without airspace consolidation or  effusion. Cardiomediastinal silhouette and remainder of the exam is unchanged. IMPRESSION: No active cardiopulmonary disease. Electronically Signed   By: Marin Olp M.D.   On: 10/11/2022 16:07    Procedures Procedures    Medications Ordered in ED Medications  sodium chloride 0.9 % bolus 1,000 mL (1,000 mLs Intravenous New Bag/Given 10/11/22 1742)    ED Course/ Medical Decision Making/ A&P                             Medical Decision Making Amount and/or Complexity of Data Reviewed Labs: ordered. Radiology: ordered.  This patient presents to the ED for concern of syncope, this involves a number of treatment options, and is a complaint that carries with it a high risk of complications and morbidity.  The differential diagnosis includes hypoglycemia, vasovagal, orthostatics versus dehydration.    Co morbidities: Discussed in HPI   Brief History:  Patient presents to the ED  sent in by urgent care status for syncopal episode x 2 yesterday.  Patient reports loss of consciousness, being out for approximately 5 to 10 minutes.  He did feel dizzy prior to passing out, but has never had any episodes like these in the past.  Does report he feels somewhat dehydrated, as he is been working a lot, and reports he does not drink or eat much on shift.  EMR reviewed including pt PMHx, past surgical history and past visits to ER.   See HPI for more details   Lab Tests:  I ordered and independently interpreted labs.  The pertinent results include:    I personally reviewed all laboratory work and imaging. Metabolic panel without any acute abnormality specifically kidney function within normal limits and no significant electrolyte abnormalities. CBC without leukocytosis or significant anemia.   Imaging Studies:  NAD. I personally reviewed all imaging studies and no acute abnormality found. I agree with radiology interpretation. CT Head showed no acute findings.   Cardiac Monitoring:  The patient was maintained on a cardiac monitor.  I personally viewed and interpreted the cardiac monitored which showed an underlying rhythm of: NSR EKG non-ischemic  Medicines ordered:  I ordered medication including bolus  for hydration Reevaluation of the patient after these medicines showed that the patient improved I have reviewed the patients home medicines and have made adjustments as needed  Reevaluation:  After the interventions noted above I re-evaluated patient and found that they have :improved   Social Determinants of Health:  The patient's social determinants of health were a factor in the care of this patient  Problem List / ED Course:  Patient here s/p syncopal episode x 2 yesterday.  Reports he was at a friend's house suddenly he suffered a syncopal episode, struck his head on the counter.  Reports that he was noted to be hypotensive with EMS at the blood pressure of  70/40.  Did not lose consciousness, no prior episodes in the past.  Today's visit he is endorsing a little bit of a headache, went to urgent care to be evaluated and sent here for further evaluation. Interpretation of his labs by me and reveal CBC with no leukocytosis, hemoglobin is unremarkable.  BMP with no electrolyte derangement, creatinine levels unremarkable.  LFTs were added however he reports no alcohol intake or abdominal pain. First troponin is negative, will obtain delta.Chest xray without any signs of infection or acute findings. CT Head showed no acute findings. I have a low suspicion for  cardiac etiology with no chest pain or sob and reassuring cardiac workup. No concern for infection. He received fluids while in the ED we discussed appropriate follow up with primary care physician.  Patient is agreeable to plan treatment, return precautions discussed at length.   Dispostion:  After consideration of the diagnostic results and the patients response to treatment, I feel that the patent would benefit from follow up with PCP.    Portions of this note were generated with [employer]. Dictation errors may occur despite best attempts at proofreading.   Final Clinical Impression(s) / ED Diagnoses Final diagnoses:  Syncope, unspecified syncope type    Rx / DC Orders ED Discharge Orders     None         Janeece Fitting, Hershal Coria 10/11/22 1836    Isla Pence, MD 10/11/22 [phone]

## 2022-10-11 NOTE — ED Provider Notes (Signed)
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  Patient states that he passed out last night and was unresponsive for 10 minutes, came around and passed out again.  Patient states his girlfriend who is with him called EMS.  Patient states that when EMS arrived they advised him that his blood pressure was 70/40.  Patient states they gave him IV fluids which got his blood pressure back to normal.  Patient states he was advised to go to the emergency room but declined transport.  States EMS made him promise to come to urgent care for evaluation today.  On arrival, patient's blood pressure was 142/87 today.  Patient denies any history of heart disease.  Patient reports feeling fatigued and weak but otherwise well at this time.  Per observation, patient's left eye is injected but he seems to be mentating well and ambulated independently to the clinic today.  Patient states he drove himself to urgent care today.  EKG performed during his EC visit today was compared to the EKG performed by EMS, which he brought with him, there are some significant changes in anterior lead, patient also has a known right bundle branch block as seen on EKG performed in February 2023.  Patient was advised to go to the emergency room for further evaluation given concern for occlusion myocardial infarction given patient's previous history of complaints of chest pain without diagnosed reason for chest pain.   Lynden Oxford Scales, PA-C 10/11/22 1351

## 2022-10-11 NOTE — ED Triage Notes (Signed)
Pt reports last he went from a sitting position and passed out and hit the corner of his RT eye. NO bruesing  or lac seen . Pt reported his girl friend had to shake him . Pt reports he not responsive for 10 mins. Pt 's girl friend called EMS who assessed Pt ,gave iv fluids the patient refused to go to [facility] but EMS requested he come to [facility] today for follow up. Pt BP in triage 142/87 . Pt denies any heart conditions. Pt denies LOC before.

## 2022-10-11 NOTE — Discharge Instructions (Addendum)
Your laboratory results are within normal limits today.  You were given a referral for the [facility] health and wellness clinic, you will need to make an appointment in order to obtain further primary care follow-up.

## 2022-10-11 NOTE — ED Provider Triage Note (Signed)
Emergency Medicine Provider Triage Evaluation Note  Trevor Ramos , a 52 y.o. male  was evaluated in triage.  Pt complains of syncopal episode last night, reportedly lasting about 15 minutes. Patient endorses limited oral intake due to hectic work schedule. Patient was seen by EMS, found to be hypotensive but not tachycardic, given Iv fluids with good blood pressure.response. Patient followed-up at urgent care today. Sent to ED with concern for cardiac etiology due to abnormal ECG.  Review of Systems  Positive: Syncope, conjunctivitis left eye Negative: Chest pain, nausea, vomiting, abdominal pain  Physical Exam  BP (!) 143/96   Pulse 87   Temp 98.5 F (36.9 C) (Oral)   Resp 12   SpO2 100%  Gen:   Awake, no distress   Resp:  Normal effort  MSK:   Moves extremities without difficulty  Other:  Conjunctivitis of left eye. Wears contacts. Patient to remove contact from left eye.  Medical Decision Making  Medically screening exam initiated at 2:24 PM.  Appropriate orders placed.  Trevor Ramos was informed that the remainder of the evaluation will be completed by another provider, this initial triage assessment does not replace that evaluation, and the importance of remaining in the ED until their evaluation is complete.     Etta Quill, NP 10/11/22 1435

## 2022-11-06 ENCOUNTER — Encounter (HOSPITAL_BASED_OUTPATIENT_CLINIC_OR_DEPARTMENT_OTHER): Payer: Self-pay | Admitting: Emergency Medicine

## 2022-11-06 ENCOUNTER — Emergency Department (HOSPITAL_BASED_OUTPATIENT_CLINIC_OR_DEPARTMENT_OTHER)
Admission: EM | Admit: 2022-11-06 | Discharge: 2022-11-07 | Disposition: A | Discharge status: Home / Self Care | Payer: Self-pay | Attending: Emergency Medicine | Admitting: Emergency Medicine

## 2022-11-06 ENCOUNTER — Other Ambulatory Visit: Payer: Self-pay

## 2022-11-06 DIAGNOSIS — H16293 Other keratoconjunctivitis, bilateral: Secondary | ICD-10-CM | POA: Insufficient documentation

## 2022-11-06 DIAGNOSIS — H169 Unspecified keratitis: Secondary | ICD-10-CM

## 2022-11-06 NOTE — ED Triage Notes (Signed)
Eyes red since yesterday, reports that he has rinsed with tap water at home without relief.  Bilateral redness and swelling noted to eyes Reports some blurriness, tearing from bilateral eyes

## 2022-11-07 MED ORDER — OFLOXACIN 0.3 % OP SOLN
1.0000 [drp] | Freq: Three times a day (TID) | OPHTHALMIC | Status: DC
  Administered 2022-11-07: 1 [drp] via OPHTHALMIC
  Filled 2022-11-07: qty 5

## 2022-11-07 MED ORDER — FLUORESCEIN SODIUM 1 MG OP STRP
ORAL_STRIP | OPHTHALMIC | Status: AC
  Filled 2022-11-07: qty 1

## 2022-11-07 MED ORDER — TIMOLOL MALEATE 0.5 % OP SOLN
1.0000 [drp] | Freq: Every day | OPHTHALMIC | Status: DC
  Administered 2022-11-07: 1 [drp] via OPHTHALMIC
  Filled 2022-11-07: qty 5

## 2022-11-07 MED ORDER — TETRACAINE HCL 0.5 % OP SOLN
2.0000 [drp] | Freq: Once | OPHTHALMIC | Status: AC
Start: 2022-11-07 — End: 2022-11-07
  Administered 2022-11-07: 2 [drp] via OPHTHALMIC
  Filled 2022-11-07: qty 4

## 2022-11-07 NOTE — Discharge Instructions (Signed)
Please use the Oflaxacin antibiotic drops three times a day and the Timolol eye pressure drops once a day. Follow up with Dr. Katy Fitch over the weekend as above.

## 2022-11-07 NOTE — ED Provider Notes (Signed)
[facility]  Provider Note  CSN: [id number] Arrival date & time: 11/06/22 2213  History Chief Complaint  Patient presents with   Eye Problem    Trevor Ramos is a 52 y.o. male contact lens wearer reports bilateral eye redness and swelling worsening since yesterday. He tried washing them out with tap water. Clear drainage. Some blurry vision due to tearing. No fevers. Mild pain R>L. Lenses are out now.    Home Medications Prior to Admission medications   Medication Sig Start Date End Date Taking? Authorizing Provider  famotidine (PEPCID) 20 MG tablet Take 1 tablet (20 mg total) by mouth 2 (two) times daily. 11/18/21 12/18/21  Regan Lemming, MD  lidocaine (LIDODERM) 5 % Place 1 patch onto the skin daily. Remove & Discard patch within 12 hours or as directed by MD 11/18/21   Regan Lemming, MD  omeprazole (PRILOSEC) 20 MG capsule Take 20 mg by mouth daily as needed.    [provider]  pantoprazole (PROTONIX) 40 MG tablet Take 1 tablet (40 mg total) by mouth daily. 11/18/21 12/18/21  Regan Lemming, MD     Allergies    Patient has no known allergies.   Review of Systems   Review of Systems Please see HPI for pertinent positives and negatives  Physical Exam BP (!) 144/93 (BP Location: Right Arm)   Pulse 76   Temp 98.7 F (37.1 C)   Resp 18   SpO2 100%   Physical Exam Vitals and nursing note reviewed.  HENT:     Head: Normocephalic.     Nose: Nose normal.  Eyes:     Extraocular Movements: Extraocular movements intact.     Comments: Marked conjunctival injection and chemosis. Anterior chambers are clear. Pupils reactive. Clear drainage  Pulmonary:     Effort: Pulmonary effort is normal.  Musculoskeletal:        General: Normal range of motion.     Cervical back: Neck supple.  Skin:    Findings: No rash (on exposed skin).  Neurological:     Mental Status: He is alert and oriented to person, place, and time.   Psychiatric:        Mood and Affect: Mood normal.     ED Results / Procedures / Treatments   EKG None  Procedures Procedures  Medications Ordered in the ED Medications  tetracaine (PONTOCAINE) 0.5 % ophthalmic solution 2 drop (has no administration in time range)  fluorescein 1 MG ophthalmic strip (has no administration in time range)  ofloxacin (OCUFLOX) 0.3 % ophthalmic solution 1 drop (has no administration in time range)  timolol (TIMOPTIC) 0.5 % ophthalmic solution 1 drop (has no administration in time range)    Initial Impression and Plan  Patient with bilateral conjunctivitis, will check fluorescein exam and pressures.   ED Course   Clinical Course as of 11/07/22 0126  Sat Nov 07, 2022  0047 Tonopen with IOP 40 in Right and 39 in Left. There is diffuse punctate fluorescein uptake but no large abrasions or dendritic lesions.  [CS]  84 Spoke with Dr. Katy Fitch, Ophthalmologist on call, who recommends starting Ofloxacin, Timolol drops and he will see the patient in office later today or tomorrow. Patient aware of plan. He will leave his contacts out.  [CS]    Clinical Course User Index [CS] Truddie Hidden, MD     MDM Rules/Calculators/A&P Medical Decision Making Problems Addressed: Keratitis: acute illness or injury  Risk Prescription drug management.  Final Clinical Impression(s) / ED Diagnoses Final diagnoses:  Keratitis    Rx / DC Orders ED Discharge Orders     None        Truddie Hidden, MD 11/07/22 0126

## 2023-06-10 ENCOUNTER — Ambulatory Visit: Payer: Self-pay | Admitting: Family Medicine

## 2023-06-10 ENCOUNTER — Telehealth: Payer: Self-pay | Admitting: General Practice

## 2023-06-10 NOTE — Telephone Encounter (Signed)
Called patient to reschedule missed new patient appointment, let patient know to call office back

## 2023-08-04 ENCOUNTER — Ambulatory Visit: Payer: BC Managed Care – PPO | Admitting: Internal Medicine

## 2023-08-04 ENCOUNTER — Encounter: Payer: Self-pay | Admitting: Internal Medicine

## 2023-08-04 ENCOUNTER — Ambulatory Visit (INDEPENDENT_AMBULATORY_CARE_PROVIDER_SITE_OTHER): Payer: BC Managed Care – PPO

## 2023-08-04 ENCOUNTER — Other Ambulatory Visit: Payer: Self-pay | Admitting: Internal Medicine

## 2023-08-04 VITALS — BP 144/100 | HR 76 | Temp 98.3°F | Ht 69.0 in | Wt 200.8 lb

## 2023-08-04 DIAGNOSIS — Z1159 Encounter for screening for other viral diseases: Secondary | ICD-10-CM

## 2023-08-04 DIAGNOSIS — K219 Gastro-esophageal reflux disease without esophagitis: Secondary | ICD-10-CM

## 2023-08-04 DIAGNOSIS — E559 Vitamin D deficiency, unspecified: Secondary | ICD-10-CM

## 2023-08-04 DIAGNOSIS — M25552 Pain in left hip: Secondary | ICD-10-CM | POA: Insufficient documentation

## 2023-08-04 DIAGNOSIS — E538 Deficiency of other specified B group vitamins: Secondary | ICD-10-CM | POA: Diagnosis not present

## 2023-08-04 DIAGNOSIS — E7800 Pure hypercholesterolemia, unspecified: Secondary | ICD-10-CM | POA: Diagnosis not present

## 2023-08-04 DIAGNOSIS — Z0001 Encounter for general adult medical examination with abnormal findings: Secondary | ICD-10-CM | POA: Diagnosis not present

## 2023-08-04 DIAGNOSIS — Z125 Encounter for screening for malignant neoplasm of prostate: Secondary | ICD-10-CM

## 2023-08-04 DIAGNOSIS — R739 Hyperglycemia, unspecified: Secondary | ICD-10-CM | POA: Insufficient documentation

## 2023-08-04 DIAGNOSIS — R972 Elevated prostate specific antigen [PSA]: Secondary | ICD-10-CM | POA: Diagnosis not present

## 2023-08-04 DIAGNOSIS — E785 Hyperlipidemia, unspecified: Secondary | ICD-10-CM

## 2023-08-04 DIAGNOSIS — G8929 Other chronic pain: Secondary | ICD-10-CM | POA: Insufficient documentation

## 2023-08-04 DIAGNOSIS — R030 Elevated blood-pressure reading, without diagnosis of hypertension: Secondary | ICD-10-CM

## 2023-08-04 HISTORY — DX: Hyperlipidemia, unspecified: E78.5

## 2023-08-04 LAB — CBC WITH DIFFERENTIAL/PLATELET
Basophils Absolute: 0 10*3/uL (ref 0.0–0.1)
Basophils Relative: 0.6 % (ref 0.0–3.0)
Eosinophils Absolute: 0.1 10*3/uL (ref 0.0–0.7)
Eosinophils Relative: 1.2 % (ref 0.0–5.0)
HCT: 42.1 % (ref 39.0–52.0)
Hemoglobin: 13.5 g/dL (ref 13.0–17.0)
Lymphocytes Relative: 34.1 % (ref 12.0–46.0)
Lymphs Abs: 1.6 10*3/uL (ref 0.7–4.0)
MCHC: 32 g/dL (ref 30.0–36.0)
MCV: 75.8 fL — ABNORMAL LOW (ref 78.0–100.0)
Monocytes Absolute: 0.6 10*3/uL (ref 0.1–1.0)
Monocytes Relative: 13.8 % — ABNORMAL HIGH (ref 3.0–12.0)
Neutro Abs: 2.3 10*3/uL (ref 1.4–7.7)
Neutrophils Relative %: 50.3 % (ref 43.0–77.0)
Platelets: 240 10*3/uL (ref 150.0–400.0)
RBC: 5.56 Mil/uL (ref 4.22–5.81)
RDW: 15.4 % (ref 11.5–15.5)
WBC: 4.6 10*3/uL (ref 4.0–10.5)

## 2023-08-04 LAB — HEPATIC FUNCTION PANEL
ALT: 20 U/L (ref 0–53)
AST: 20 U/L (ref 0–37)
Albumin: 4 g/dL (ref 3.5–5.2)
Alkaline Phosphatase: 62 U/L (ref 39–117)
Bilirubin, Direct: 0.1 mg/dL (ref 0.0–0.3)
Total Bilirubin: 0.4 mg/dL (ref 0.2–1.2)
Total Protein: 7.1 g/dL (ref 6.0–8.3)

## 2023-08-04 LAB — LIPID PANEL
Cholesterol: 177 mg/dL (ref 0–200)
HDL: 61.3 mg/dL (ref >39.00)
LDL Cholesterol: 108 mg/dL — ABNORMAL HIGH (ref 0–99)
NonHDL: 115.71
Total CHOL/HDL Ratio: 3
Triglycerides: 37 mg/dL (ref 0.0–149.0)
VLDL: 7.4 mg/dL (ref 0.0–40.0)

## 2023-08-04 LAB — BASIC METABOLIC PANEL
BUN: 13 mg/dL (ref 6–23)
CO2: 27 meq/L (ref 19–32)
Calcium: 9.1 mg/dL (ref 8.4–10.5)
Chloride: 104 meq/L (ref 96–112)
Creatinine, Ser: 0.88 mg/dL (ref 0.40–1.50)
GFR: 99.6 mL/min (ref >60.00)
Glucose, Bld: 73 mg/dL (ref 70–99)
Potassium: 4.5 meq/L (ref 3.5–5.1)
Sodium: 140 meq/L (ref 135–145)

## 2023-08-04 LAB — TSH: TSH: 1.03 u[IU]/mL (ref 0.35–5.50)

## 2023-08-04 LAB — HEMOGLOBIN A1C: Hgb A1c MFr Bld: 5.5 % (ref 4.6–6.5)

## 2023-08-04 LAB — VITAMIN D 25 HYDROXY (VIT D DEFICIENCY, FRACTURES): VITD: 7.31 ng/mL — ABNORMAL LOW (ref 30.00–100.00)

## 2023-08-04 LAB — VITAMIN B12: Vitamin B-12: 750 pg/mL (ref 211–911)

## 2023-08-04 LAB — PSA: PSA: 7.01 ng/mL — ABNORMAL HIGH (ref 0.10–4.00)

## 2023-08-04 MED ORDER — MELOXICAM 15 MG PO TABS
15.0000 mg | ORAL_TABLET | Freq: Every day | ORAL | 1 refills | Status: DC | PRN
Start: 2023-08-04 — End: 2024-04-07

## 2023-08-04 NOTE — Progress Notes (Unsigned)
Patient ID: Trevor Ramos, male   DOB: Dec 02, 1970, 52 y.o.   MRN: [id number]         Chief Complaint:: wellness exam and Establish Care (Establish Care. Upper left leg pain x1 year, currently treating with OTC pain meds (beir, aspirin). Has tried stretches in the past which did not alleviate pain)  , psa elevation, hyperglycemia, hld, gerd,        HPI:  Trevor Ramos is a 52 y.o. male here for wellness exam; declines covid booster, for shignrix at pharmacy, o/w up to date                        Also with 1 yr hx of left groin pain issue, MRI for the lower back several yrs ago per ortho in [place] neg per pt.  Pt denies chest pain, increased sob or doe, wheezing, orthopnea, PND, increased LE swelling, palpitations, dizziness or syncope.   Pt denies polydipsia, polyuria, or new focal neuro s/s.   Marland Kitchen Pt denies fever, wt loss, night sweats, loss of appetite, or other constitutional symptoms  Has had mild worsening reflux, but no abd pain, dysphagia, n/v, bowel change or blood.   Wt Readings from Last 3 Encounters:  08/04/23 200 lb 12.8 oz (91.1 kg)  03/29/20 232 lb 2.3 oz (105.3 kg)  02/26/18 240 lb (108.9 kg)   BP Readings from Last 3 Encounters:  08/04/23 (!) 144/100  11/07/22 (!) 138/91  10/11/22 (!) 145/101   Immunization History  Administered Date(s) Administered   Influenza-Unspecified 07/02/2023   Tdap 01/15/2016   Health Maintenance Due  Topic Date Due   Zoster Vaccines- Shingrix (1 of 2) Never done   COVID-19 Vaccine (1 - 2023-24 season) Never done      Past Medical History:  Diagnosis Date   Chronic back pain    GERD (gastroesophageal reflux disease)    HLD (hyperlipidemia) 08/04/2023   Past Surgical History:  Procedure Laterality Date   LIPOMA EXCISION Right 03/29/2020   Procedure: EXCISION LARGE LIPOMA OF RIGHT ARM 9x12 CM, 6 CM deep;  Surgeon: Luretha Murphy, MD;  Location: [facility];  Service: General;  Laterality: Right;    reports that he has been  smoking cigars. He has never used smokeless tobacco. He reports current alcohol use. He reports that he does not use drugs. family history includes Healthy in his father and mother; Hypertension in his mother. No Known Allergies No current outpatient medications on file prior to visit.   No current facility-administered medications on file prior to visit.        ROS:  All others reviewed and negative.  Objective        PE:  BP (!) 144/100   Pulse 76   Temp 98.3 F (36.8 C) (Oral)   Ht 5\' 9"  (1.753 m)   Wt 200 lb 12.8 oz (91.1 kg)   SpO2 99%   BMI 29.65 kg/m                 Constitutional: Pt appears in NAD               HENT: Head: NCAT.                Right Ear: External ear normal.                 Left Ear: External ear normal.  Eyes: . Pupils are equal, round, and reactive to light. Conjunctivae and EOM are normal               Nose: without d/c or deformity               Neck: Neck supple. Gross normal ROM               Cardiovascular: Normal rate and regular rhythm.                 Pulmonary/Chest: Effort normal and breath sounds without rales or wheezing.                Abd:  Soft, NT, ND, + BS, no organomegaly               Neurological: Pt is alert. At baseline orientation, motor grossly intact               Skin: Skin is warm. No rashes, no other new lesions, LE edema - none; left hip with severe reduced flexion, severe pain on internal rotation, limps to walk with gait disorder               Psychiatric: Pt behavior is normal without agitation   Micro: none  Cardiac tracings I have personally interpreted today:  none  Pertinent Radiological findings (summarize): none   Lab Results  Component Value Date   WBC 4.6 08/04/2023   HGB 13.5 08/04/2023   HCT 42.1 08/04/2023   PLT 240.0 08/04/2023   GLUCOSE 73 08/04/2023   CHOL 177 08/04/2023   TRIG 37.0 08/04/2023   HDL 61.30 08/04/2023   LDLCALC 108 (H) 08/04/2023   ALT 20 08/04/2023   AST 20  08/04/2023   NA 140 08/04/2023   K 4.5 08/04/2023   CL 104 08/04/2023   CREATININE 0.88 08/04/2023   BUN 13 08/04/2023   CO2 27 08/04/2023   TSH 1.03 08/04/2023   PSA 7.01 (H) 08/04/2023   INR 0.97 01/12/2015   HGBA1C 5.5 08/04/2023   Assessment/Plan:  Trevor Ramos is a 52 y.o. Black or African American [2] male with  has a past medical history of Chronic back pain, GERD (gastroesophageal reflux disease), and HLD (hyperlipidemia) (08/04/2023).  Encounter for well adult exam with abnormal findings Age and sex appropriate education and counseling updated with regular exercise and diet Referrals for preventative services - none needed Immunizations addressed - declines covid booster, for shingrix at pharmacy Smoking counseling  - none needed Evidence for depression or other mood disorder - none significant Most recent labs reviewed. I have personally reviewed and have noted: 1) the patient's medical and social history 2) The patient's current medications and supplements 3) The patient's height, weight, and BMI have been recorded in the chart   PSA elevation Lab Results  Component Value Date   PSA 7.01 (H) 08/04/2023   Asympt, for urology referral  Left hip pain With mod to severe pain, for nsaid prn, for xray plain film, also refer to orthopedic, suspect underlying let hip DJD may need surgical repair  Hyperglycemia Lab Results  Component Value Date   HGBA1C 5.5 08/04/2023   Stable, pt to continue current medical treatment  - diet, wt control  HLD (hyperlipidemia) Lab Results  Component Value Date   LDLCALC 108 (H) 08/04/2023   Uncontrolled, pt for lower chol diet, declines statin  Vitamin D deficiency .Last vitamin D Lab Results  Component Value Date   VD25OH 7.31 (L)  08/04/2023   Low, to start oral replacement   Followup: Return in about 6 months (around 02/01/2024).  Oliver Barre, MD 08/05/2023 9:16 PM [facility]  Primary Care -  [facility] Internal Medicine

## 2023-08-04 NOTE — Patient Instructions (Signed)
Please take all new medication as prescribed - the anti-inflammatory as needed for pain  Ok to take the Tylenol as needed for pain as well, as needed  Please continue all other medications as before, and refills have been done if requested.  Please have the pharmacy call with any other refills you may need.  Please continue your efforts at being more active, low cholesterol diet, and weight control.  You are otherwise up to date with prevention measures today.  Please keep your appointments with your specialists as you may have planned  You will be contacted regarding the referral for: orthopedic  Please go to the XRAY Department in the first floor for the x-ray testing  Please go to the LAB at the blood drawing area for the tests to be done  You will be contacted by phone if any changes need to be made immediately.  Otherwise, you will receive a letter about your results with an explanation, but please check with MyChart first.  Please remember to sign up for MyChart if you have not done so, as this will be important to you in the future with finding out test results, communicating by private email, and scheduling acute appointments online when needed.  Please make an Appointment to return in 6 months, or sooner if needed, also with Lab Appointment for testing done 3-5 days before at the FIRST FLOOR Lab (so this is for TWO appointments - please see the scheduling desk as you leave)

## 2023-08-05 ENCOUNTER — Encounter: Payer: Self-pay | Admitting: Internal Medicine

## 2023-08-05 DIAGNOSIS — E559 Vitamin D deficiency, unspecified: Secondary | ICD-10-CM | POA: Insufficient documentation

## 2023-08-05 LAB — HEPATITIS C ANTIBODY: Hepatitis C Ab: NONREACTIVE

## 2023-08-05 NOTE — Assessment & Plan Note (Signed)
Lab Results  Component Value Date   HGBA1C 5.5 08/04/2023   Stable, pt to continue current medical treatment  - diet, wt control

## 2023-08-05 NOTE — Assessment & Plan Note (Signed)
Lab Results  Component Value Date   PSA 7.01 (H) 08/04/2023   Asympt, for urology referral

## 2023-08-05 NOTE — Assessment & Plan Note (Addendum)
With mod to severe pain, for nsaid prn, for xray plain film, also refer to orthopedic, suspect underlying let hip DJD may need surgical repair

## 2023-08-05 NOTE — Assessment & Plan Note (Signed)
Lab Results  Component Value Date   LDLCALC 108 (H) 08/04/2023   Uncontrolled, pt for lower chol diet, declines statin

## 2023-08-05 NOTE — Assessment & Plan Note (Signed)
.  Last vitamin D Lab Results  Component Value Date   VD25OH 7.31 (L) 08/04/2023   Low, to start oral replacement

## 2023-08-05 NOTE — Assessment & Plan Note (Signed)
Age and sex appropriate education and counseling updated with regular exercise and diet Referrals for preventative services - none needed Immunizations addressed - declines covid booster, for shingrix at pharmacy Smoking counseling  - none needed Evidence for depression or other mood disorder - none significant Most recent labs reviewed. I have personally reviewed and have noted: 1) the patient's medical and social history 2) The patient's current medications and supplements 3) The patient's height, weight, and BMI have been recorded in the chart  

## 2023-08-06 ENCOUNTER — Telehealth: Payer: Self-pay | Admitting: Internal Medicine

## 2023-08-06 NOTE — Telephone Encounter (Signed)
Zack returned Pt call.

## 2023-08-06 NOTE — Telephone Encounter (Signed)
Patient returned Trevor Ramos's call about his lab results and would like a call back at [phone].

## 2023-08-20 ENCOUNTER — Encounter: Payer: Self-pay | Admitting: Internal Medicine

## 2023-10-04 ENCOUNTER — Telehealth: Payer: Self-pay

## 2023-10-04 MED ORDER — METRONIDAZOLE 500 MG PO TABS: 500.0000 mg | ORAL_TABLET | Freq: Three times a day (TID) | ORAL | 0 refills | Status: AC

## 2023-10-04 NOTE — Addendum Note (Signed)
 Addended by: Corwin Levins on: 10/04/2023 07:22 PM   Modules accepted: Orders

## 2023-10-04 NOTE — Telephone Encounter (Signed)
 Copied from CRM [id number]. Topic: Clinical - Medical Advice >> Oct 04, 2023  3:45 PM Trevor Ramos wrote:  Reason for CRM: Patient is requesting to speak with Dr. Jonny Ruiz or his nurse regarding a infection that was discussed at his last appointment.

## 2023-10-04 NOTE — Telephone Encounter (Signed)
 Ok for tx for presumed trichomonas

## 2023-11-17 ENCOUNTER — Encounter: Payer: BC Managed Care – PPO | Admitting: Internal Medicine

## 2024-04-07 ENCOUNTER — Ambulatory Visit: Admitting: Internal Medicine

## 2024-04-07 VITALS — BP 156/102 | HR 77 | Temp 98.0°F | Ht 69.0 in | Wt 200.2 lb

## 2024-04-07 DIAGNOSIS — I10 Essential (primary) hypertension: Secondary | ICD-10-CM

## 2024-04-07 DIAGNOSIS — E559 Vitamin D deficiency, unspecified: Secondary | ICD-10-CM

## 2024-04-07 DIAGNOSIS — R739 Hyperglycemia, unspecified: Secondary | ICD-10-CM

## 2024-04-07 DIAGNOSIS — M25552 Pain in left hip: Secondary | ICD-10-CM | POA: Diagnosis not present

## 2024-04-07 MED ORDER — TRAMADOL HCL 50 MG PO TABS: 50.0000 mg | ORAL_TABLET | Freq: Four times a day (QID) | ORAL | 0 refills | Status: AC | PRN

## 2024-04-07 MED ORDER — VALSARTAN 160 MG PO TABS: 160.0000 mg | ORAL_TABLET | Freq: Every day | ORAL | 3 refills | Status: AC

## 2024-04-07 MED ORDER — MELOXICAM 15 MG PO TABS: 15.0000 mg | ORAL_TABLET | Freq: Every day | ORAL | 1 refills | Status: DC | PRN

## 2024-04-07 NOTE — Assessment & Plan Note (Addendum)
 Worsening overall now mod to severe, still working at [employer], nov 2024 xray c/w hip arthritis but pain much worse now in a short time; no recent falls or injury but walking funny and starting to affect his work   Pt now for tramadol  prn, mobic  15 mg every day prn, MRI Left Hip, and refer [place] ortho.  Differential includes worsening DJD, vs labral tear vs other.

## 2024-04-07 NOTE — Patient Instructions (Signed)
 Please take all new medication as prescribed- - the tramadol  for pain, the meloxicam  for anti-inflammatory, and generic for diovan 160 mg per day for blood pressure  Please call if you need more tramadol  as we are only allowed to start with a small amount by law  Please remember to monitor your BP at least twice per day for 10 days, and hopefully you will see the BP become less than 130/80  Please continue all other medications as before, and refills have been done if requested.  Please have the pharmacy call with any other refills you may need.  Please keep your appointments with your specialists as you may have planned   You will be contacted regarding the referral for: MRI left hip, and [facility]  Please make an Appointment to return in 3 months, or sooner if needed

## 2024-04-07 NOTE — Progress Notes (Signed)
 Patient ID: Trevor Ramos, male   DOB: [birth year], 53 y.o.   MRN: [id number]        Chief Complaint: follow up left hip pain, htn, hyperglycemia, low vit d       HPI:  Trevor Ramos is a 53 y.o. male here with wife, c/o 6 mo gradually worsening left hip with groin pain, now mod to severe, worse to walk, stand or lifting heavy box at his [employer] all day long.  Some better with otc nsaid but still not controlled.  No falls, no hx of malignancy.  Had plain films nov 2024 with bilateral hip DJD, likely mod by description it seems to me.  Pt denies chest pain, increased sob or doe, wheezing, orthopnea, PND, increased LE swelling, palpitations, dizziness or syncope.   Pt denies polydipsia, polyuria, or new focal neuro s/s.   .          Wt Readings from Last 3 Encounters:  04/07/24 200 lb 4 oz (90.8 kg)  08/04/23 200 lb 12.8 oz (91.1 kg)  03/29/20 232 lb 2.3 oz (105.3 kg)   BP Readings from Last 3 Encounters:  04/07/24 (!) 156/102  08/04/23 (!) 144/100  11/07/22 (!) 138/91         Past Medical History:  Diagnosis Date   Chronic back pain    GERD (gastroesophageal reflux disease)    HLD (hyperlipidemia) 08/04/2023   Past Surgical History:  Procedure Laterality Date   LIPOMA EXCISION Right 03/29/2020   Procedure: EXCISION LARGE LIPOMA OF RIGHT ARM 9x12 CM, 6 CM deep;  Surgeon: Gladis Cough, MD;  Location: [facility];  Service: General;  Laterality: Right;    reports that he has been smoking cigars. He has never used smokeless tobacco. He reports current alcohol use. He reports that he does not use drugs. family history includes Healthy in his father and mother; Hypertension in his mother. No Known Allergies No current outpatient medications on file prior to visit.   No current facility-administered medications on file prior to visit.        ROS:  All others reviewed and negative.  Objective        PE:  BP (!) 156/102   Pulse 77   Temp 98 F (36.7 C) (Temporal)   Ht  5' 9 (1.753 m)   Wt 200 lb 4 oz (90.8 kg)   SpO2 97%   BMI 29.57 kg/m                 Constitutional: Pt appears in NAD               HENT: Head: NCAT.                Right Ear: External ear normal.                 Left Ear: External ear normal.                Eyes: . Pupils are equal, round, and reactive to light. Conjunctivae and EOM are normal               Nose: without d/c or deformity               Neck: Neck supple. Gross normal ROM               Cardiovascular: Normal rate and regular rhythm.  Pulmonary/Chest: Effort normal and breath sounds without rales or wheezing.                Abd:  Soft, NT, ND, + BS, no organomegaly               Neurological: Pt is alert. At baseline orientation, motor grossly intact               Skin: Skin is warm. No rashes, no other new lesions, LE edema - none;  left hip with marked pain on minimal flexion and internal external rotation               Psychiatric: Pt behavior is normal without agitation   Micro: none  Cardiac tracings I have personally interpreted today:  none  Pertinent Radiological findings (summarize): none   Lab Results  Component Value Date   WBC 4.6 08/04/2023   HGB 13.5 08/04/2023   HCT 42.1 08/04/2023   PLT 240.0 08/04/2023   GLUCOSE 73 08/04/2023   CHOL 177 08/04/2023   TRIG 37.0 08/04/2023   HDL 61.30 08/04/2023   LDLCALC 108 (H) 08/04/2023   ALT 20 08/04/2023   AST 20 08/04/2023   NA 140 08/04/2023   K 4.5 08/04/2023   CL 104 08/04/2023   CREATININE 0.88 08/04/2023   BUN 13 08/04/2023   CO2 27 08/04/2023   TSH 1.03 08/04/2023   PSA 7.01 (H) 08/04/2023   INR 0.97 01/12/2015   HGBA1C 5.5 08/04/2023   Assessment/Plan:  Trevor Ramos is a 53 y.o. Black or African American [2] male with  has a past medical history of Chronic back pain, GERD (gastroesophageal reflux disease), and HLD (hyperlipidemia) (08/04/2023).  Left hip pain Worsening overall now mod to severe, still working at  [employer], nov 2024 xray c/w hip arthritis but pain much worse now in a short time; no recent falls or injury but walking funny and starting to affect his work   Pt now for tramadol  prn, mobic  15 mg every day prn, MRI Left Hip, and refer [place] ortho.  Differential includes worsening DJD, vs labral tear vs other.   HTN (hypertension) BP Readings from Last 3 Encounters:  04/07/24 (!) 156/102  08/04/23 (!) 144/100  11/07/22 (!) 138/91   uncontrolled, pt to start diovan  160 mg qd   Vitamin D  deficiency Last vitamin D  Lab Results  Component Value Date   VD25OH 7.31 (L) 08/04/2023   Low, to start oral replacement   Hyperglycemia Lab Results  Component Value Date   HGBA1C 5.5 08/04/2023   Stable, pt to continue current medical treatment  - diet, wt control  Followup: Return in about 3 months (around 07/08/2024).  Lynwood Rush, MD 04/08/2024 1:22 PM [facility] Primary Care - [facility]

## 2024-04-08 ENCOUNTER — Encounter: Payer: Self-pay | Admitting: Internal Medicine

## 2024-04-08 NOTE — Assessment & Plan Note (Signed)
 BP Readings from Last 3 Encounters:  04/07/24 (!) 156/102  08/04/23 (!) 144/100  11/07/22 (!) 138/91   uncontrolled, pt to start diovan  160 mg qd

## 2024-04-08 NOTE — Assessment & Plan Note (Signed)
.  Last vitamin D Lab Results  Component Value Date   VD25OH 7.31 (L) 08/04/2023   Low, to start oral replacement

## 2024-04-08 NOTE — Assessment & Plan Note (Signed)
 Lab Results  Component Value Date   HGBA1C 5.5 08/04/2023   Stable, pt to continue current medical treatment  - diet, wt control

## 2024-04-10 ENCOUNTER — Telehealth: Payer: Self-pay

## 2024-04-10 NOTE — Telephone Encounter (Unsigned)
 Copied from CRM [id number]. Topic: General - Other >> Apr 10, 2024  1:53 PM Rosina BIRCH wrote: Reason for CRM: patient called stating he received some medication last week and he want to see if he can get some stronger pain medication because he still feel the pain. CB [phone]

## 2024-04-12 NOTE — Telephone Encounter (Signed)
 Called and left detailed message for patient to confirm if the tramadol  or the meloxicam  needed an increased dosage. Also wanted to follow up on how pain was now.

## 2024-04-20 ENCOUNTER — Inpatient Hospital Stay: Admission: RE | Admit: 2024-04-20 | Source: Ambulatory Visit

## 2024-04-24 NOTE — Addendum Note (Signed)
 Addended by: ONEITA SLOUGH R on: 04/24/2024 04:26 PM   Modules accepted: Orders

## 2024-04-24 NOTE — Telephone Encounter (Unsigned)
 Copied from CRM [id number]. Topic: Clinical - Medication Refill >> Apr 24, 2024  4:01 PM Jasmin G wrote: Medication: meloxicam  (MOBIC ) 15 MG tablet  Has the patient contacted their pharmacy? Yes (Agent: If no, request that the patient contact the pharmacy for the refill. If patient does not wish to contact the pharmacy document the reason why and proceed with request.) (Agent: If yes, when and what did the pharmacy advise?)  This is the patient's preferred pharmacy:  [facility] [phone] - [place], [place] - [address] AT [facility] [address] [place] [address] Phone: [phone] Fax: [phone]  Is this the correct pharmacy for this prescription? Yes If no, delete pharmacy and type the correct one.   Has the prescription been filled recently? No  Is the patient out of the medication? Yes  Has the patient been seen for an appointment in the last year OR does the patient have an upcoming appointment? Yes  Can we respond through MyChart? Yes  Agent: Please be advised that Rx refills may take up to 3 business days. We ask that you follow-up with your pharmacy.

## 2024-04-26 MED ORDER — MELOXICAM 15 MG PO TABS: 15.0000 mg | ORAL_TABLET | Freq: Every day | ORAL | 1 refills | Status: AC | PRN

## 2024-04-26 NOTE — Telephone Encounter (Signed)
 Ok for refill, but unable to change the dose as the 15 mg is the max dose    thanks

## 2024-04-26 NOTE — Addendum Note (Signed)
 Addended by: NORLEEN LYNWOOD ORN on: 04/26/2024 10:14 AM   Modules accepted: Orders

## 2024-04-27 ENCOUNTER — Telehealth: Payer: Self-pay

## 2024-04-27 NOTE — Telephone Encounter (Signed)
 Copied from CRM [id number]. Topic: Clinical - Medical Advice >> Apr 27, 2024  2:07 PM [place] wrote: Reason for CRM: Patient wanting to know if he can get a letter stating his diagnosis from the last visit and recommendations on treatment. Please contact patient as soon as possible. He is wanting to get paperwork filled out for FMLA work

## 2024-04-28 NOTE — Telephone Encounter (Signed)
 Unfortunately we dont have a good diagnosis to work with, as the MRI has not been done apparently.   I cannot authorize FMLA for just a symptom of left hip pain.   Sorry, thanks

## 2024-04-28 NOTE — Telephone Encounter (Signed)
 Sorry, same answer, I would not feel comfortable with an FMLA for left hip pain

## 2024-05-18 ENCOUNTER — Telehealth: Payer: Self-pay

## 2024-05-18 NOTE — Telephone Encounter (Signed)
 Copied from CRM [id number]. Topic: Clinical - Medical Advice >> May 18, 2024 12:30 PM Harlene ORN wrote: Reason for CRM: Patient called and asked to speak to his PCP in reference to his Hip Replacement Surgery on 10/21st. Also, please talk to the surgeron as well. Please call back the patient.

## 2024-05-24 NOTE — Telephone Encounter (Signed)
 Called and spoke with patient. Provided him with our fax number to send over temporary leave form.  Called and spoke with [facility] and they explained the only thing they should require from us  will be the clearance form for the procedure which had not been received yet. They did note that it might be a little early for it to be sent out

## 2024-05-26 ENCOUNTER — Telehealth: Payer: Self-pay | Admitting: Radiology

## 2024-05-26 NOTE — Telephone Encounter (Signed)
 Copied from CRM #[id number]. Topic: Clinical - Medical Advice >> May 26, 2024 12:47 PM Kevelyn M wrote: Reason for CRM: Patient would like the nurse to give him a call back. It's regards to a surgery the patient is having in a few weeks.   Call back #[phone]

## 2024-05-30 ENCOUNTER — Other Ambulatory Visit: Payer: Self-pay | Admitting: Internal Medicine

## 2024-05-30 DIAGNOSIS — I10 Essential (primary) hypertension: Secondary | ICD-10-CM

## 2024-05-30 DIAGNOSIS — R739 Hyperglycemia, unspecified: Secondary | ICD-10-CM

## 2024-06-01 NOTE — Telephone Encounter (Signed)
 All inpatients have social services assigned at the hospital to assist with DME needs and home care that is needed after the hospital stay.  The surgeon can just as well order this as I can.  So hopefully this wont be an issue.   thanks

## 2024-06-01 NOTE — Telephone Encounter (Signed)
 Called and spoke with patient. They are wanting to know what the process would be for getting home care post surgery? Patient stays on his one and is worried about managing things while in the healing process  Also informed him he would be due for labs prior to procedure being completed

## 2024-06-01 NOTE — Telephone Encounter (Signed)
 Called and relayed response back to patient. Patient expressed understanding

## 2024-06-07 ENCOUNTER — Telehealth: Payer: Self-pay

## 2024-06-07 NOTE — Telephone Encounter (Signed)
 Patient submitted documentation for his ST Disability prior to his left hip surgery. Currently scheduled for 07/11/2024. Called patient to get details and he indicated that he may have to push out his surgery. Informed patient we would not be able to completed until those surgical details, patient understsood and said he would reach out when he is ready to schedule.  Copy of current ST Disability documentation scanned into patient media.

## 2024-06-26 ENCOUNTER — Telehealth: Payer: Self-pay

## 2024-06-26 NOTE — Telephone Encounter (Signed)
 Copied from CRM [id number]. Topic: Clinical - Lab/Test Results >> Jun 26, 2024  8:20 AM Darshell M wrote: Reason for CRM: Kerri from [facility] calling to obtain Lab/Results - CBC and B. Also needs last office visit note for surgery patient is scheduled for on 07/11/2024. Kerri CB#[phone], Fax [phone]

## 2024-06-27 NOTE — Telephone Encounter (Signed)
 Labs and fax sent 06/27/2024

## 2024-07-03 ENCOUNTER — Telehealth: Payer: Self-pay

## 2024-07-03 NOTE — Telephone Encounter (Signed)
 Called and let Pt know labs have been placed.

## 2024-07-03 NOTE — Telephone Encounter (Signed)
 Copied from CRM [id number]. Topic: Clinical - Request for Lab/Test Order >> Jul 03, 2024  2:17 PM Viola F wrote: Reason for CRM: Patient has surgery 07/11/24 with [facility] [phone]) and called to schedule lab appt, there are lab orders in from 05/30/24 from Dr. Norleen but not sure if these are the labs he needs for surgery?

## 2024-07-04 ENCOUNTER — Other Ambulatory Visit (INDEPENDENT_AMBULATORY_CARE_PROVIDER_SITE_OTHER)

## 2024-07-04 DIAGNOSIS — R739 Hyperglycemia, unspecified: Secondary | ICD-10-CM | POA: Diagnosis not present

## 2024-07-04 DIAGNOSIS — I10 Essential (primary) hypertension: Secondary | ICD-10-CM | POA: Diagnosis not present

## 2024-07-04 LAB — CBC WITH DIFFERENTIAL/PLATELET
Basophils Absolute: 0 K/uL (ref 0.0–0.1)
Basophils Relative: 0.8 % (ref 0.0–3.0)
Eosinophils Absolute: 0.1 K/uL (ref 0.0–0.7)
Eosinophils Relative: 1.6 % (ref 0.0–5.0)
HCT: 41.3 % (ref 39.0–52.0)
Hemoglobin: 12.9 g/dL — ABNORMAL LOW (ref 13.0–17.0)
Lymphocytes Relative: 48.1 % — ABNORMAL HIGH (ref 12.0–46.0)
Lymphs Abs: 2.6 K/uL (ref 0.7–4.0)
MCHC: 31.3 g/dL (ref 30.0–36.0)
MCV: 74.8 fl — ABNORMAL LOW (ref 78.0–100.0)
Monocytes Absolute: 0.4 K/uL (ref 0.1–1.0)
Monocytes Relative: 8.2 % (ref 3.0–12.0)
Neutro Abs: 2.3 K/uL (ref 1.4–7.7)
Neutrophils Relative %: 41.3 % — ABNORMAL LOW (ref 43.0–77.0)
Platelets: 250 K/uL (ref 150.0–400.0)
RBC: 5.52 Mil/uL (ref 4.22–5.81)
RDW: 15.9 % — ABNORMAL HIGH (ref 11.5–15.5)
WBC: 5.5 K/uL (ref 4.0–10.5)

## 2024-07-04 LAB — BASIC METABOLIC PANEL WITH GFR
BUN: 13 mg/dL (ref 6–23)
CO2: 30 meq/L (ref 19–32)
Calcium: 9 mg/dL (ref 8.4–10.5)
Chloride: 104 meq/L (ref 96–112)
Creatinine, Ser: 0.85 mg/dL (ref 0.40–1.50)
GFR: 100 mL/min (ref >60.00)
Glucose, Bld: 90 mg/dL (ref 70–99)
Potassium: 4.2 meq/L (ref 3.5–5.1)
Sodium: 139 meq/L (ref 135–145)

## 2024-07-04 LAB — LIPID PANEL
Cholesterol: 173 mg/dL (ref 0–200)
HDL: 55.4 mg/dL (ref >39.00)
LDL Cholesterol: 109 mg/dL — ABNORMAL HIGH (ref 0–99)
NonHDL: 117.82
Total CHOL/HDL Ratio: 3
Triglycerides: 46 mg/dL (ref 0.0–149.0)
VLDL: 9.2 mg/dL (ref 0.0–40.0)

## 2024-07-04 LAB — HEPATIC FUNCTION PANEL
ALT: 17 U/L (ref 0–53)
AST: 18 U/L (ref 0–37)
Albumin: 4.1 g/dL (ref 3.5–5.2)
Alkaline Phosphatase: 51 U/L (ref 39–117)
Bilirubin, Direct: 0.1 mg/dL (ref 0.0–0.3)
Total Bilirubin: 0.4 mg/dL (ref 0.2–1.2)
Total Protein: 7.1 g/dL (ref 6.0–8.3)

## 2024-07-04 LAB — HEMOGLOBIN A1C: Hgb A1c MFr Bld: 5.6 % (ref 4.6–6.5)

## 2024-07-14 ENCOUNTER — Ambulatory Visit: Admitting: Internal Medicine
